# Patient Record
Sex: Female | Born: 1940 | Race: White | Hispanic: No | Smoking: Never smoker
Health system: Southern US, Community
[De-identification: ages and names within clinical notes are randomized; demographics above are authoritative.]

## PROBLEM LIST (undated history)

## (undated) DIAGNOSIS — S52509A Unspecified fracture of the lower end of unspecified radius, initial encounter for closed fracture: Secondary | ICD-10-CM

## (undated) DIAGNOSIS — I10 Essential (primary) hypertension: Secondary | ICD-10-CM

## (undated) DIAGNOSIS — J329 Chronic sinusitis, unspecified: Secondary | ICD-10-CM

## (undated) DIAGNOSIS — E785 Hyperlipidemia, unspecified: Secondary | ICD-10-CM

## (undated) HISTORY — PX: CHOLECYSTECTOMY: SHX55

## (undated) HISTORY — PX: TONSILLECTOMY: SUR1361

## (undated) HISTORY — PX: APPENDECTOMY: SHX54

## (undated) HISTORY — PX: ABDOMINAL HYSTERECTOMY: SHX81

---

## 1997-11-01 ENCOUNTER — Ambulatory Visit (HOSPITAL_COMMUNITY): Admission: RE | Admit: 1997-11-01 | Discharge: 1997-11-01 | Payer: Self-pay | Admitting: Obstetrics and Gynecology

## 1999-08-30 ENCOUNTER — Ambulatory Visit (HOSPITAL_COMMUNITY): Admission: RE | Admit: 1999-08-30 | Discharge: 1999-08-30 | Payer: Self-pay | Admitting: Family Medicine

## 1999-08-30 ENCOUNTER — Encounter: Payer: Self-pay | Admitting: Family Medicine

## 2000-10-29 ENCOUNTER — Encounter: Payer: Self-pay | Admitting: Family Medicine

## 2000-10-29 ENCOUNTER — Encounter: Admission: RE | Admit: 2000-10-29 | Discharge: 2000-10-29 | Payer: Self-pay | Admitting: Family Medicine

## 2002-01-14 ENCOUNTER — Encounter: Admission: RE | Admit: 2002-01-14 | Discharge: 2002-01-14 | Payer: Self-pay | Admitting: Family Medicine

## 2002-01-14 ENCOUNTER — Encounter: Payer: Self-pay | Admitting: Family Medicine

## 2003-02-15 ENCOUNTER — Encounter: Admission: RE | Admit: 2003-02-15 | Discharge: 2003-02-15 | Payer: Self-pay | Admitting: Family Medicine

## 2003-02-15 ENCOUNTER — Encounter: Payer: Self-pay | Admitting: Family Medicine

## 2004-03-22 ENCOUNTER — Encounter: Admission: RE | Admit: 2004-03-22 | Discharge: 2004-03-22 | Payer: Self-pay | Admitting: Family Medicine

## 2005-04-26 ENCOUNTER — Encounter: Admission: RE | Admit: 2005-04-26 | Discharge: 2005-04-26 | Payer: Self-pay | Admitting: Family Medicine

## 2005-09-10 ENCOUNTER — Encounter: Admission: RE | Admit: 2005-09-10 | Discharge: 2005-09-10 | Payer: Self-pay | Admitting: Family Medicine

## 2006-05-30 ENCOUNTER — Encounter: Admission: RE | Admit: 2006-05-30 | Discharge: 2006-05-30 | Payer: Self-pay | Admitting: Family Medicine

## 2007-06-02 ENCOUNTER — Encounter: Admission: RE | Admit: 2007-06-02 | Discharge: 2007-06-02 | Payer: Self-pay | Admitting: Family Medicine

## 2008-06-02 ENCOUNTER — Encounter: Admission: RE | Admit: 2008-06-02 | Discharge: 2008-06-02 | Payer: Self-pay | Admitting: Family Medicine

## 2008-06-13 ENCOUNTER — Encounter: Admission: RE | Admit: 2008-06-13 | Discharge: 2008-06-13 | Payer: Self-pay | Admitting: Family Medicine

## 2009-07-13 ENCOUNTER — Encounter: Admission: RE | Admit: 2009-07-13 | Discharge: 2009-07-13 | Payer: Self-pay | Admitting: Family Medicine

## 2010-07-18 ENCOUNTER — Encounter
Admission: RE | Admit: 2010-07-18 | Discharge: 2010-07-18 | Payer: Self-pay | Source: Home / Self Care | Admitting: Family Medicine

## 2011-07-10 ENCOUNTER — Other Ambulatory Visit: Payer: Self-pay | Admitting: Family Medicine

## 2011-07-10 DIAGNOSIS — Z1231 Encounter for screening mammogram for malignant neoplasm of breast: Secondary | ICD-10-CM

## 2011-08-09 ENCOUNTER — Ambulatory Visit
Admission: RE | Admit: 2011-08-09 | Discharge: 2011-08-09 | Disposition: A | Discharge status: Home / Self Care | Payer: Medicare Other | Source: Ambulatory Visit | Attending: Family Medicine | Admitting: Family Medicine

## 2011-08-09 DIAGNOSIS — Z1231 Encounter for screening mammogram for malignant neoplasm of breast: Secondary | ICD-10-CM

## 2012-07-08 ENCOUNTER — Other Ambulatory Visit: Payer: Self-pay | Admitting: Family Medicine

## 2012-07-20 ENCOUNTER — Other Ambulatory Visit: Payer: Self-pay | Admitting: Family Medicine

## 2012-07-20 DIAGNOSIS — Z1231 Encounter for screening mammogram for malignant neoplasm of breast: Secondary | ICD-10-CM

## 2012-08-28 ENCOUNTER — Ambulatory Visit
Admission: RE | Admit: 2012-08-28 | Discharge: 2012-08-28 | Disposition: A | Discharge status: Home / Self Care | Payer: Medicare Other | Source: Ambulatory Visit | Attending: Family Medicine | Admitting: Family Medicine

## 2012-08-28 DIAGNOSIS — Z1231 Encounter for screening mammogram for malignant neoplasm of breast: Secondary | ICD-10-CM

## 2013-08-10 ENCOUNTER — Other Ambulatory Visit: Payer: Self-pay

## 2013-08-10 DIAGNOSIS — Z1231 Encounter for screening mammogram for malignant neoplasm of breast: Secondary | ICD-10-CM

## 2013-09-03 ENCOUNTER — Ambulatory Visit
Admission: RE | Admit: 2013-09-03 | Discharge: 2013-09-03 | Disposition: A | Discharge status: Home / Self Care | Payer: Medicare Other | Source: Ambulatory Visit

## 2013-09-03 DIAGNOSIS — Z1231 Encounter for screening mammogram for malignant neoplasm of breast: Secondary | ICD-10-CM

## 2013-10-02 ENCOUNTER — Encounter (HOSPITAL_BASED_OUTPATIENT_CLINIC_OR_DEPARTMENT_OTHER): Payer: Self-pay | Admitting: Emergency Medicine

## 2013-10-02 ENCOUNTER — Emergency Department (HOSPITAL_BASED_OUTPATIENT_CLINIC_OR_DEPARTMENT_OTHER): Payer: Medicare Other

## 2013-10-02 ENCOUNTER — Emergency Department (HOSPITAL_BASED_OUTPATIENT_CLINIC_OR_DEPARTMENT_OTHER)
Admission: EM | Admit: 2013-10-02 | Discharge: 2013-10-02 | Disposition: A | Discharge status: Home / Self Care | Payer: Medicare Other | Attending: Emergency Medicine | Admitting: Emergency Medicine

## 2013-10-02 DIAGNOSIS — Z8709 Personal history of other diseases of the respiratory system: Secondary | ICD-10-CM | POA: Insufficient documentation

## 2013-10-02 DIAGNOSIS — S62113A Displaced fracture of triquetrum [cuneiform] bone, unspecified wrist, initial encounter for closed fracture: Secondary | ICD-10-CM | POA: Insufficient documentation

## 2013-10-02 DIAGNOSIS — Z79899 Other long term (current) drug therapy: Secondary | ICD-10-CM | POA: Insufficient documentation

## 2013-10-02 DIAGNOSIS — E119 Type 2 diabetes mellitus without complications: Secondary | ICD-10-CM | POA: Insufficient documentation

## 2013-10-02 DIAGNOSIS — Y9289 Other specified places as the place of occurrence of the external cause: Secondary | ICD-10-CM | POA: Insufficient documentation

## 2013-10-02 DIAGNOSIS — E785 Hyperlipidemia, unspecified: Secondary | ICD-10-CM | POA: Insufficient documentation

## 2013-10-02 DIAGNOSIS — Y9389 Activity, other specified: Secondary | ICD-10-CM | POA: Insufficient documentation

## 2013-10-02 DIAGNOSIS — W010XXA Fall on same level from slipping, tripping and stumbling without subsequent striking against object, initial encounter: Secondary | ICD-10-CM | POA: Insufficient documentation

## 2013-10-02 DIAGNOSIS — S61209A Unspecified open wound of unspecified finger without damage to nail, initial encounter: Secondary | ICD-10-CM | POA: Insufficient documentation

## 2013-10-02 DIAGNOSIS — Z7982 Long term (current) use of aspirin: Secondary | ICD-10-CM | POA: Insufficient documentation

## 2013-10-02 HISTORY — DX: Chronic sinusitis, unspecified: J32.9

## 2013-10-02 HISTORY — DX: Hyperlipidemia, unspecified: E78.5

## 2013-10-02 NOTE — ED Notes (Signed)
Fell on ice today, landing on her buttocks, left arm.  C/o left wrist pain.  Ambulatory without difficulty.  Denies striking head or LOC.  Currently taking antibiotic for sinusitis.

## 2013-10-02 NOTE — Discharge Instructions (Signed)
Wrist Fracture A wrist fracture is a break in one of the bones of the wrist. Your wrist is made up of several small bones at the palm of your hand (carpal bones) and the two bones that make up your forearm (radius and ulna). The bones come together to form multiple large and small joints. The shape and design of these joints allow your wrist to bend and straighten, move side-to-side, and rotate, as in twisting your palm up or down. CAUSES  A fracture may occur in any of the bones in your wrist when enough force is applied to the wrist, such as when falling down onto an outstretched hand. Severe injuries may occur from a more forceful injury. SYMPTOMS Symptoms of wrist fractures include tenderness, bruising, and swelling. Additionally, the wrist may hang in an odd position or may be misshaped. DIAGNOSIS To diagnose a wrist fracture, your caregiver will physically examine your wrist. Your caregiver may also request an X-ray exam of your wrist. TREATMENT Treatment depends on many factors, including the nature and location of the fracture, your age, and your activity level. Treatment for wrist fracture can be nonsurgical or surgical. For nonsurgical treatment, a plaster cast or splint may be applied to your wrist if the bone is in a good position (aligned). The cast will stay on for about 6 weeks. If the alignment of your bone is not good, it may be necessary to realign (reduce) it. After the bone is reduced, a splint usually is placed on your wrist to allow for a small amount of normal swelling. After about 1 week, the splint is removed and a cast is added. The cast is removed 2 or 3 weeks later, after the swelling goes down, causing the cast to loosen. Another cast is applied. This cast is removed after about another 2 or 3 weeks, for a total of 4 to 6 weeks of immobilization. Sometimes the position of the bone is so far out of place that surgery is required to apply a device to hold it together as it  heals. If the bone cannot be reduced without cutting the skin around the bone (closed reduction), a cut (incision) is made to allow direct access to the bone to reduce it (open reduction). Depending on the fracture, there are a number of options for holding the bone in place while it heals, including a cast, metal pins, a plate and screws, and a device called an external fixator. With an external fixator, most of the hardware remains outside of the body. HOME CARE INSTRUCTIONS  To lessen swelling, keep your injured wrist elevated and move your fingers as much as possible.  Apply ice to your wrist for the first 1 to 2 days after you have been treated or as directed by your caregiver. Applying ice helps to reduce inflammation and pain.  Put ice in a plastic bag.  Place a towel between your skin and the bag.  Leave the ice on for 15 to 20 minutes at a time, every 2 hours while you are awake.  Do not put pressure on any part of your cast or splint. It may break.  Use a plastic bag to protect your cast or splint from water while bathing or showering. Do not lower your cast or splint into water.  Only take over-the-counter or prescription medicines for pain as directed by your caregiver. SEEK IMMEDIATE MEDICAL CARE IF:   Your cast or splint gets damaged or breaks.  You have continued severe pain   or more swelling than you did before the cast was put on.  Your skin or fingernails below the injury turn blue or gray or feel cold or numb.  You develop decreased feeling in your fingers. MAKE SURE YOU:  Understand these instructions.  Will watch your condition.  Will get help right away if you are not doing well or get worse. Document Released: 05/08/2005 Document Revised: 10/21/2011 Document Reviewed: 08/16/2011 [employer] Patient Information 2014 [employer].  

## 2013-10-02 NOTE — ED Provider Notes (Signed)
CSN: [id number]     Arrival date & time 10/02/13  1621 History  This chart was scribed for Rolan Bucco, MD by Dorothey Baseman, ED Scribe. This patient was seen in room MH05/MH05 and the patient's care was started at 5:38 PM.    Chief Complaint  Patient presents with  . Fall   The history is provided by the patient. No language interpreter was used.   HPI Comments: Mekaylah Klich is a 73 y.o. female who presents to the Emergency Department complaining of a fall that occurred earlier today when she reports that she slipped on ice, causing her to land on her buttocks and left arm. She denies hitting her head or loss of consciousness. Patient is complaining of a constant pain with associated swelling to the left wrist that she states has been progressively worsening since the incident. She reports taking an aspirin at home without significant relief of her symptoms. Patient also presents with a small laceration to the right index finger secondary to the incident, but she applied a bandage to the area PTA and there is no active bleeding at this time. She denies headache, nausea, emesis, chest pain, shortness of breath, neck pain, back pain. Patient has a history of DM and hyperlipidemia.   Past Medical History  Diagnosis Date  . Sinusitis   . Diabetes mellitus without complication   . Hyperlipidemia    Past Surgical History  Procedure Laterality Date  . Tonsillectomy    . Appendectomy    . Cholecystectomy    . Abdominal hysterectomy     No family history on file. History  Substance Use Topics  . Smoking status: Never Smoker   . Smokeless tobacco: Not on file  . Alcohol Use: Yes   OB History   Grav Para Term Preterm Abortions TAB SAB Ect Mult Living                 Review of Systems  Constitutional: Negative for fever.  Respiratory: Negative for shortness of breath.   Cardiovascular: Negative for chest pain.  Gastrointestinal: Negative for nausea and vomiting.  Musculoskeletal:  Positive for arthralgias (left wrist) and joint swelling. Negative for back pain and neck pain.  Skin: Positive for wound (laceration, right index finger).  Neurological: Negative for syncope, weakness, numbness and headaches.    Allergies  Review of patient's allergies indicates not on file.  Home Medications   Current Outpatient Rx  Name  Route  Sig  Dispense  Refill  . aspirin 81 MG tablet   Oral   Take 81 mg by mouth daily.         Marland Kitchen estradiol (ESTRACE) 0.5 MG tablet   Oral   Take 0.5 mg by mouth daily.         Marland Kitchen losartan (COZAAR) 25 MG tablet   Oral   Take 25 mg by mouth daily.         . simvastatin (ZOCOR) 20 MG tablet   Oral   Take 20 mg by mouth daily.          Triage Vitals: BP 147/73  Pulse 83  Temp(Src) 96.8 F (36 C) (Oral)  Resp 16  Ht 5\' 1"  (1.549 m)  Wt 135 lb (61.236 kg)  BMI 25.52 kg/m2  SpO2 98%  Physical Exam  Constitutional: She is oriented to person, place, and time. She appears well-developed and well-nourished.  HENT:  Head: Normocephalic and atraumatic.  Eyes: Pupils are equal, round, and reactive to light.  Neck: Normal range of motion. Neck supple.  No pain along the cervical, thoracic, or lumbosacral spine.   Pulmonary/Chest: Effort normal. No respiratory distress.  Abdominal: Soft. She exhibits no distension.  Musculoskeletal: Normal range of motion. She exhibits no edema.  Left hand has tenderness diffusely around the wrist. Ecchymosis to the volar surface of the wrist on the ulnar side. No bony tenderness to the hand or elbow. Neurovascularly intact. Radial pulses intact. No other pain with palpation or range of motion of other extremities.   Lymphadenopathy:    She has no cervical adenopathy.  Neurological: She is alert and oriented to person, place, and time.  Skin: Skin is warm and dry. No rash noted.  Right hand has a small superficial abrasion adjacent to the lateral nail. No nailbed injury.   Psychiatric: She has a  normal mood and affect.    ED Course  Procedures (including critical care time)  DIAGNOSTIC STUDIES: Oxygen Saturation is 98% on room air, normal by my interpretation.    COORDINATION OF CARE: 5:41 PM- Discussed that x-ray results indicate a fracture in the left wrist. Will discharge patient with a splint. Advised her to follow up with the referred orthopedist. Offered patient pain medication, but she states that she does not like its effects. Discussed treatment plan with patient at bedside and patient verbalized agreement.     Labs Review Labs Reviewed - No data to display  Imaging Review Dg Wrist Complete Left  10/02/2013   CLINICAL DATA:  Larey SeatFell on the ice and injured the left wrist.  EXAM: LEFT WRIST - COMPLETE 3+ VIEW  COMPARISON:  DG WRIST COMPLETE*L* dated 09/10/2005  FINDINGS: Avulsion fracture involving the triquetrum, visualized on the lateral image. No other fractures. Osseous demineralization. Well preserved joint spaces for age. Well corticated benign appearing cyst in the lunate.  IMPRESSION: Avulsion fracture involving the triquetrum.   Electronically Signed   By: Hulan Saashomas  Lawrence M.D.   On: 10/02/2013 17:24    EKG Interpretation   None       MDM   Final diagnoses:  Triquetral chip fracture    Patient placed in a volar splint. I will refer her to followup with hand surgeon.  I personally performed the services described in this documentation, which was scribed in my presence.  The recorded information has been reviewed and considered.     Rolan BuccoMelanie Audrielle Vankuren, MD 10/02/13 [phone]

## 2015-04-26 ENCOUNTER — Other Ambulatory Visit: Payer: Self-pay | Admitting: Family Medicine

## 2015-04-26 ENCOUNTER — Ambulatory Visit
Admission: RE | Admit: 2015-04-26 | Discharge: 2015-04-26 | Disposition: A | Discharge status: Home / Self Care | Payer: Medicare Other | Source: Ambulatory Visit | Attending: Family Medicine | Admitting: Family Medicine

## 2015-04-26 DIAGNOSIS — R609 Edema, unspecified: Secondary | ICD-10-CM

## 2015-04-26 DIAGNOSIS — W19XXXA Unspecified fall, initial encounter: Secondary | ICD-10-CM

## 2015-05-15 ENCOUNTER — Encounter (HOSPITAL_BASED_OUTPATIENT_CLINIC_OR_DEPARTMENT_OTHER): Payer: Self-pay | Admitting: *Deleted

## 2015-05-15 ENCOUNTER — Other Ambulatory Visit: Payer: Self-pay | Admitting: Orthopedic Surgery

## 2015-05-16 ENCOUNTER — Other Ambulatory Visit: Payer: Self-pay

## 2015-05-16 ENCOUNTER — Encounter (HOSPITAL_BASED_OUTPATIENT_CLINIC_OR_DEPARTMENT_OTHER)
Admission: RE | Admit: 2015-05-16 | Discharge: 2015-05-16 | Disposition: A | Discharge status: Home / Self Care | Payer: Medicare Other | Source: Ambulatory Visit | Attending: Orthopedic Surgery | Admitting: Orthopedic Surgery

## 2015-05-16 DIAGNOSIS — X58XXXA Exposure to other specified factors, initial encounter: Secondary | ICD-10-CM | POA: Diagnosis not present

## 2015-05-16 DIAGNOSIS — I10 Essential (primary) hypertension: Secondary | ICD-10-CM | POA: Diagnosis not present

## 2015-05-16 DIAGNOSIS — E785 Hyperlipidemia, unspecified: Secondary | ICD-10-CM | POA: Diagnosis not present

## 2015-05-16 DIAGNOSIS — Z7982 Long term (current) use of aspirin: Secondary | ICD-10-CM | POA: Diagnosis not present

## 2015-05-16 DIAGNOSIS — S52572A Other intraarticular fracture of lower end of left radius, initial encounter for closed fracture: Secondary | ICD-10-CM | POA: Diagnosis not present

## 2015-05-16 LAB — BASIC METABOLIC PANEL
Anion gap: 7 (ref 5–15)
BUN: 19 mg/dL (ref 6–20)
CALCIUM: 9.3 mg/dL (ref 8.9–10.3)
CO2: 26 mmol/L (ref 22–32)
CREATININE: 0.77 mg/dL (ref 0.44–1.00)
Chloride: 106 mmol/L (ref 101–111)
GLUCOSE: 99 mg/dL (ref 65–99)
Potassium: 3.9 mmol/L (ref 3.5–5.1)
Sodium: 139 mmol/L (ref 135–145)

## 2015-05-18 ENCOUNTER — Encounter (HOSPITAL_BASED_OUTPATIENT_CLINIC_OR_DEPARTMENT_OTHER): Payer: Self-pay

## 2015-05-18 ENCOUNTER — Ambulatory Visit (HOSPITAL_BASED_OUTPATIENT_CLINIC_OR_DEPARTMENT_OTHER)
Admission: RE | Admit: 2015-05-18 | Discharge: 2015-05-18 | Disposition: A | Discharge status: Home / Self Care | Payer: Medicare Other | Source: Ambulatory Visit | Attending: Orthopedic Surgery | Admitting: Orthopedic Surgery

## 2015-05-18 ENCOUNTER — Ambulatory Visit (HOSPITAL_BASED_OUTPATIENT_CLINIC_OR_DEPARTMENT_OTHER): Payer: Medicare Other | Admitting: Anesthesiology

## 2015-05-18 ENCOUNTER — Encounter (HOSPITAL_BASED_OUTPATIENT_CLINIC_OR_DEPARTMENT_OTHER)
Admission: RE | Disposition: A | Discharge status: Home / Self Care | Payer: Self-pay | Source: Ambulatory Visit | Attending: Orthopedic Surgery

## 2015-05-18 DIAGNOSIS — Z7982 Long term (current) use of aspirin: Secondary | ICD-10-CM | POA: Insufficient documentation

## 2015-05-18 DIAGNOSIS — E785 Hyperlipidemia, unspecified: Secondary | ICD-10-CM | POA: Insufficient documentation

## 2015-05-18 DIAGNOSIS — S52572A Other intraarticular fracture of lower end of left radius, initial encounter for closed fracture: Secondary | ICD-10-CM | POA: Insufficient documentation

## 2015-05-18 DIAGNOSIS — I10 Essential (primary) hypertension: Secondary | ICD-10-CM | POA: Diagnosis not present

## 2015-05-18 DIAGNOSIS — X58XXXA Exposure to other specified factors, initial encounter: Secondary | ICD-10-CM | POA: Insufficient documentation

## 2015-05-18 HISTORY — DX: Essential (primary) hypertension: I10

## 2015-05-18 HISTORY — PX: OPEN REDUCTION INTERNAL FIXATION (ORIF) DISTAL RADIAL FRACTURE: SHX5989

## 2015-05-18 HISTORY — DX: Unspecified fracture of the lower end of unspecified radius, initial encounter for closed fracture: S52.509A

## 2015-05-18 SURGERY — OPEN REDUCTION INTERNAL FIXATION (ORIF) DISTAL RADIUS FRACTURE
Anesthesia: Regional | Site: Wrist | Laterality: Left

## 2015-05-18 MED ORDER — LIDOCAINE HCL (CARDIAC) 20 MG/ML IV SOLN
INTRAVENOUS | Status: DC | PRN
  Administered 2015-05-18: 50 mg via INTRAVENOUS

## 2015-05-18 MED ORDER — SUCCINYLCHOLINE CHLORIDE 20 MG/ML IJ SOLN
INTRAMUSCULAR | Status: DC | PRN
  Administered 2015-05-18: 100 mg via INTRAVENOUS

## 2015-05-18 MED ORDER — CEFAZOLIN SODIUM-DEXTROSE 2-3 GM-% IV SOLR
2.0000 g | INTRAVENOUS | Status: AC
  Administered 2015-05-18: 2 g via INTRAVENOUS

## 2015-05-18 MED ORDER — DEXAMETHASONE SODIUM PHOSPHATE 10 MG/ML IJ SOLN
INTRAMUSCULAR | Status: DC | PRN
  Administered 2015-05-18: 10 mg via INTRAVENOUS

## 2015-05-18 MED ORDER — FENTANYL CITRATE (PF) 100 MCG/2ML IJ SOLN
50.0000 ug | INTRAMUSCULAR | Status: DC | PRN
  Administered 2015-05-18: 50 ug via INTRAVENOUS
  Administered 2015-05-18: 100 ug via INTRAVENOUS

## 2015-05-18 MED ORDER — PROMETHAZINE HCL 25 MG/ML IJ SOLN: 6.2500 mg | INTRAMUSCULAR | Status: DC | PRN

## 2015-05-18 MED ORDER — ONDANSETRON HCL 4 MG/2ML IJ SOLN
INTRAMUSCULAR | Status: AC
Start: 2015-05-18 — End: 2015-05-18
  Filled 2015-05-18: qty 2

## 2015-05-18 MED ORDER — EPHEDRINE SULFATE 50 MG/ML IJ SOLN
INTRAMUSCULAR | Status: AC
  Filled 2015-05-18: qty 1

## 2015-05-18 MED ORDER — DEXAMETHASONE SODIUM PHOSPHATE 10 MG/ML IJ SOLN
INTRAMUSCULAR | Status: AC
  Filled 2015-05-18: qty 1

## 2015-05-18 MED ORDER — MIDAZOLAM HCL 2 MG/2ML IJ SOLN: 1.0000 mg | INTRAMUSCULAR | Status: DC | PRN

## 2015-05-18 MED ORDER — PROPOFOL 10 MG/ML IV BOLUS
INTRAVENOUS | Status: DC | PRN
  Administered 2015-05-18: 150 mg via INTRAVENOUS

## 2015-05-18 MED ORDER — HYDROCODONE-ACETAMINOPHEN 5-325 MG PO TABS: ORAL_TABLET | ORAL | Status: DC

## 2015-05-18 MED ORDER — MIDAZOLAM HCL 2 MG/2ML IJ SOLN
INTRAMUSCULAR | Status: AC
Start: 2015-05-18 — End: 2015-05-18
  Filled 2015-05-18: qty 2

## 2015-05-18 MED ORDER — DEXAMETHASONE SODIUM PHOSPHATE 4 MG/ML IJ SOLN
INTRAMUSCULAR | Status: DC | PRN
Start: 2015-05-18 — End: 2015-05-18
  Administered 2015-05-18: 10 mg via INTRAVENOUS

## 2015-05-18 MED ORDER — PROPOFOL 500 MG/50ML IV EMUL
INTRAVENOUS | Status: AC
  Filled 2015-05-18: qty 50

## 2015-05-18 MED ORDER — SUCCINYLCHOLINE CHLORIDE 20 MG/ML IJ SOLN
INTRAMUSCULAR | Status: AC
  Filled 2015-05-18: qty 1

## 2015-05-18 MED ORDER — OXYCODONE HCL 5 MG PO TABS
5.0000 mg | ORAL_TABLET | Freq: Once | ORAL | Status: AC
Start: 2015-05-18 — End: 2015-05-18
  Administered 2015-05-18: 5 mg via ORAL

## 2015-05-18 MED ORDER — FENTANYL CITRATE (PF) 100 MCG/2ML IJ SOLN
INTRAMUSCULAR | Status: AC
  Filled 2015-05-18: qty 4

## 2015-05-18 MED ORDER — ONDANSETRON HCL 4 MG/2ML IJ SOLN
INTRAMUSCULAR | Status: DC | PRN
  Administered 2015-05-18: 4 mg via INTRAVENOUS

## 2015-05-18 MED ORDER — CEFAZOLIN SODIUM-DEXTROSE 2-3 GM-% IV SOLR
INTRAVENOUS | Status: AC
  Filled 2015-05-18: qty 50

## 2015-05-18 MED ORDER — LIDOCAINE HCL (CARDIAC) 20 MG/ML IV SOLN
INTRAVENOUS | Status: AC
  Filled 2015-05-18: qty 5

## 2015-05-18 MED ORDER — HYDROMORPHONE HCL 1 MG/ML IJ SOLN
0.2500 mg | INTRAMUSCULAR | Status: DC | PRN
  Administered 2015-05-18: 0.5 mg via INTRAVENOUS
  Administered 2015-05-18 (×3): 0.25 mg via INTRAVENOUS
  Administered 2015-05-18: 0.5 mg via INTRAVENOUS
  Administered 2015-05-18: 0.25 mg via INTRAVENOUS

## 2015-05-18 MED ORDER — FENTANYL CITRATE (PF) 100 MCG/2ML IJ SOLN
INTRAMUSCULAR | Status: AC
  Filled 2015-05-18: qty 2

## 2015-05-18 MED ORDER — BUPIVACAINE-EPINEPHRINE (PF) 0.5% -1:200000 IJ SOLN
INTRAMUSCULAR | Status: DC | PRN
  Administered 2015-05-18: 25 mL via PERINEURAL

## 2015-05-18 MED ORDER — HYDROMORPHONE HCL 1 MG/ML IJ SOLN
INTRAMUSCULAR | Status: AC
  Filled 2015-05-18: qty 1

## 2015-05-18 MED ORDER — CHLORHEXIDINE GLUCONATE 4 % EX LIQD: 60.0000 mL | Freq: Once | CUTANEOUS | Status: DC

## 2015-05-18 MED ORDER — SCOPOLAMINE 1 MG/3DAYS TD PT72: 1.0000 | MEDICATED_PATCH | Freq: Once | TRANSDERMAL | Status: DC | PRN

## 2015-05-18 MED ORDER — LACTATED RINGERS IV SOLN
INTRAVENOUS | Status: DC
  Administered 2015-05-18 (×2): via INTRAVENOUS

## 2015-05-18 MED ORDER — GLYCOPYRROLATE 0.2 MG/ML IJ SOLN: 0.2000 mg | Freq: Once | INTRAMUSCULAR | Status: DC | PRN

## 2015-05-18 MED ORDER — EPHEDRINE SULFATE 50 MG/ML IJ SOLN
INTRAMUSCULAR | Status: DC | PRN
  Administered 2015-05-18: 10 mg via INTRAVENOUS

## 2015-05-18 MED ORDER — OXYCODONE HCL 5 MG PO TABS
ORAL_TABLET | ORAL | Status: AC
  Filled 2015-05-18: qty 1

## 2015-05-18 SURGICAL SUPPLY — 67 items
BANDAGE ELASTIC 3 VELCRO ST LF (GAUZE/BANDAGES/DRESSINGS) ×3 IMPLANT
BIT DRILL 2.0 LNG QUCK RELEASE (BIT) ×1 IMPLANT
BIT DRILL 2.8X5 QR DISP (BIT) ×3 IMPLANT
BLADE SURG 15 STRL LF DISP TIS (BLADE) ×2 IMPLANT
BLADE SURG 15 STRL SS (BLADE) ×4
BNDG ESMARK 4X9 LF (GAUZE/BANDAGES/DRESSINGS) ×3 IMPLANT
BNDG GAUZE ELAST 4 BULKY (GAUZE/BANDAGES/DRESSINGS) ×3 IMPLANT
BNDG PLASTER X FAST 3X3 WHT LF (CAST SUPPLIES) IMPLANT
CHLORAPREP W/TINT 26ML (MISCELLANEOUS) ×3 IMPLANT
CORDS BIPOLAR (ELECTRODE) ×3 IMPLANT
COVER BACK TABLE 60X90IN (DRAPES) ×3 IMPLANT
COVER MAYO STAND STRL (DRAPES) ×3 IMPLANT
DRAPE EXTREMITY T 121X128X90 (DRAPE) ×3 IMPLANT
DRAPE OEC MINIVIEW 54X84 (DRAPES) ×3 IMPLANT
DRAPE SURG 17X23 STRL (DRAPES) ×3 IMPLANT
DRILL 2.0 LNG QUICK RELEASE (BIT) ×3
GAUZE SPONGE 4X4 12PLY STRL (GAUZE/BANDAGES/DRESSINGS) ×3 IMPLANT
GAUZE XEROFORM 1X8 LF (GAUZE/BANDAGES/DRESSINGS) ×3 IMPLANT
GLOVE BIO SURGEON STRL SZ7.5 (GLOVE) ×3 IMPLANT
GLOVE BIOGEL M STRL SZ7.5 (GLOVE) ×6 IMPLANT
GLOVE BIOGEL PI IND STRL 7.0 (GLOVE) ×1 IMPLANT
GLOVE BIOGEL PI IND STRL 8 (GLOVE) ×2 IMPLANT
GLOVE BIOGEL PI IND STRL 8.5 (GLOVE) IMPLANT
GLOVE BIOGEL PI INDICATOR 7.0 (GLOVE) ×2
GLOVE BIOGEL PI INDICATOR 8 (GLOVE) ×4
GLOVE BIOGEL PI INDICATOR 8.5 (GLOVE)
GLOVE SURG ORTHO 8.0 STRL STRW (GLOVE) IMPLANT
GOWN STRL REUS W/ TWL LRG LVL3 (GOWN DISPOSABLE) ×1 IMPLANT
GOWN STRL REUS W/TWL LRG LVL3 (GOWN DISPOSABLE) ×2
GOWN STRL REUS W/TWL XL LVL3 (GOWN DISPOSABLE) ×9 IMPLANT
GUIDEWIRE ORTHO 0.054X6 (WIRE) ×9 IMPLANT
NEEDLE HYPO 25X1 1.5 SAFETY (NEEDLE) IMPLANT
NS IRRIG 1000ML POUR BTL (IV SOLUTION) ×3 IMPLANT
PACK BASIN DAY SURGERY FS (CUSTOM PROCEDURE TRAY) ×3 IMPLANT
PAD CAST 3X4 CTTN HI CHSV (CAST SUPPLIES) ×1 IMPLANT
PADDING CAST ABS 4INX4YD NS (CAST SUPPLIES) ×2
PADDING CAST ABS COTTON 4X4 ST (CAST SUPPLIES) ×1 IMPLANT
PADDING CAST COTTON 3X4 STRL (CAST SUPPLIES) ×2
PLATE PROX NARROW LEFT (Plate) ×3 IMPLANT
SCREW ACTK 2 NL HEX 3.5.11 (Screw) ×3 IMPLANT
SCREW BN FT 16X2.3XLCK HEX CRT (Screw) ×1 IMPLANT
SCREW CORT FT 20X2.3XLCK HEX (Screw) ×1 IMPLANT
SCREW CORTICAL LOCKING 2.3X16M (Screw) ×8 IMPLANT
SCREW CORTICAL LOCKING 2.3X18M (Screw) ×2 IMPLANT
SCREW CORTICAL LOCKING 2.3X20M (Screw) ×2 IMPLANT
SCREW FX16X2.3XLCK SMTH NS CRT (Screw) ×3 IMPLANT
SCREW FX18X2.3XSMTH LCK NS CRT (Screw) ×1 IMPLANT
SCREW NLCKG 13 3.5X13 HEXA (Screw) ×1 IMPLANT
SCREW NON-LOCK 3.5X13 (Screw) ×3 IMPLANT
SCREW NONLOCK HEX 3.5X12 (Screw) ×3 IMPLANT
SLEEVE SCD COMPRESS KNEE MED (MISCELLANEOUS) ×3 IMPLANT
STOCKINETTE 4X48 STRL (DRAPES) ×3 IMPLANT
SUCTION FRAZIER TIP 10 FR DISP (SUCTIONS) IMPLANT
SUT ETHILON 3 0 PS 1 (SUTURE) IMPLANT
SUT ETHILON 4 0 PS 2 18 (SUTURE) ×3 IMPLANT
SUT VIC AB 2-0 SH 27 (SUTURE)
SUT VIC AB 2-0 SH 27XBRD (SUTURE) IMPLANT
SUT VIC AB 3-0 PS1 18 (SUTURE)
SUT VIC AB 3-0 PS1 18XBRD (SUTURE) IMPLANT
SUT VICRYL 4-0 PS2 18IN ABS (SUTURE) ×3 IMPLANT
SYR BULB 3OZ (MISCELLANEOUS) ×3 IMPLANT
SYR CONTROL 10ML LL (SYRINGE) IMPLANT
TOWEL OR 17X24 6PK STRL BLUE (TOWEL DISPOSABLE) ×6 IMPLANT
TOWEL OR NON WOVEN STRL DISP B (DISPOSABLE) ×3 IMPLANT
TUBE CONNECTING 20'X1/4 (TUBING)
TUBE CONNECTING 20X1/4 (TUBING) IMPLANT
UNDERPAD 30X30 (UNDERPADS AND DIAPERS) ×3 IMPLANT

## 2015-05-18 NOTE — Discharge Instructions (Addendum)

## 2015-05-18 NOTE — Brief Op Note (Signed)
05/18/2015  10:11 AM  PATIENT:  Jill Everett  74 y.o. female  PRE-OPERATIVE DIAGNOSIS:  LEFT DISTAL RAIDUS FRACTURE  POST-OPERATIVE DIAGNOSIS:  LEFT DISTAL RAIDUS FRACTURE  PROCEDURE:  Procedure(s): OPEN REDUCTION INTERNAL FIXATION (ORIF) LEFT DISTAL RADIAL FRACTURE (Left)  SURGEON:  Surgeon(s) and Role:    * Betha Loa, MD - Primary  PHYSICIAN ASSISTANT:   ASSISTANTS: Annye Rusk, PA   ANESTHESIA:   regional and general  EBL:  Total I/O In: 1200 [I.V.:1200] Out: -   BLOOD ADMINISTERED:none  DRAINS: none   LOCAL MEDICATIONS USED:  NONE  SPECIMEN:  No Specimen  DISPOSITION OF SPECIMEN:  N/A  COUNTS:  YES  TOURNIQUET:   Total Tourniquet Time Documented: Upper Arm (Left) - 64 minutes Total: Upper Arm (Left) - 64 minutes   DICTATION: .Other Dictation: Dictation Number [id number]  PLAN OF CARE: Discharge to home after PACU  PATIENT DISPOSITION:  PACU - hemodynamically stable.

## 2015-05-18 NOTE — H&P (Signed)
Jill Everett is an 74 y.o. female.   Chief Complaint: left distal radius fracture HPI: 74 yo rhd female states she fell 04/26/15 injuring left wrist.  Seen at PCP where XR revealed distal radius fracture.  Followed up in office.  She reports previous wrist fracture that did not require surgery.  Attempted non operative treatment initially with subsequent displacement of fracture.  She wishes to undergo operative fixation.  Past Medical History  Diagnosis Date  . Sinusitis   . Hyperlipidemia   . Hypertension   . Distal radial fracture     right    Past Surgical History  Procedure Laterality Date  . Tonsillectomy    . Appendectomy    . Cholecystectomy    . Abdominal hysterectomy      History reviewed. No pertinent family history. Social History:  reports that she has never smoked. She does not have any smokeless tobacco history on file. She reports that she drinks alcohol. She reports that she does not use illicit drugs.  Allergies:  Allergies  Allergen Reactions  . Pine Other (See Comments)    Pine nuts "Made me hot and throw up"    Medications Prior to Admission  Medication Sig Dispense Refill  . aspirin 81 MG tablet Take 81 mg by mouth daily.    . Biotin (BIOTIN 5000) 5 MG CAPS Take by mouth.    . Calcium Carbonate-Vitamin D (CALCIUM-VITAMIN D) 500-200 MG-UNIT tablet Take 1 tablet by mouth daily.    Marland Kitchen losartan (COZAAR) 25 MG tablet Take 25 mg by mouth daily.    . Multiple Vitamin (MULTIVITAMIN WITH MINERALS) TABS tablet Take 1 tablet by mouth daily.    . Omega-3 Fatty Acids (FISH OIL) 1000 MG CAPS Take by mouth.    . simvastatin (ZOCOR) 20 MG tablet Take 20 mg by mouth daily.      Results for orders placed or performed during the hospital encounter of 05/18/15 (from the past 48 hour(s))  Basic metabolic panel     Status: None   Collection Time: 05/16/15  9:21 AM  Result Value Ref Range   Sodium 139 135 - 145 mmol/L   Potassium 3.9 3.5 - 5.1 mmol/L   Chloride 106 101  - 111 mmol/L   CO2 26 22 - 32 mmol/L   Glucose, Bld 99 65 - 99 mg/dL   BUN 19 6 - 20 mg/dL   Creatinine, Ser 0.77 0.44 - 1.00 mg/dL   Calcium 9.3 8.9 - 10.3 mg/dL   GFR calc non Af Amer >60 >60 mL/min   GFR calc Af Amer >60 >60 mL/min    Comment: (NOTE) The eGFR has been calculated using the CKD EPI equation. This calculation has not been validated in all clinical situations. eGFR's persistently <60 mL/min signify possible Chronic Kidney Disease.    Anion gap 7 5 - 15    No results found.   A comprehensive review of systems was negative except for: Eyes: positive for contacts/glasses  Blood pressure 143/60, pulse 78, temperature 97.7 F (36.5 C), temperature source Oral, resp. rate 15, height '5\' 1"'  (1.549 m), weight 60.555 kg (133 lb 8 oz), SpO2 98 %.  General appearance: alert, cooperative and appears stated age Head: Normocephalic, without obvious abnormality, atraumatic Neck: supple, symmetrical, trachea midline Resp: clear to auscultation bilaterally Cardio: regular rate and rhythm GI: non tender Extremities: intact sensation and capillary refill all digits.  +epl/fpl/io.  no wounds. Pulses: 2+ and symmetric Skin: Skin color, texture, turgor normal. No rashes  or lesions Neurologic: Grossly normal Incision/Wound: none  Assessment/Plan Left comminuted intraarticular distal radius fracture.  Non operative and operative treatment options were discussed with the patient and patient wishes to proceed with operative treatment. Risks, benefits, and alternatives of surgery were discussed and the patient agrees with the plan of care.  Perrie Ragin R 05/18/2015, 8:25 AM

## 2015-05-18 NOTE — Anesthesia Preprocedure Evaluation (Addendum)
Anesthesia Evaluation  Patient identified by MRN, date of birth, ID band Patient awake    Reviewed: Allergy & Precautions, NPO status , Patient's Chart, lab work & pertinent test results  Airway Mallampati: I  TM Distance: >3 FB Neck ROM: Full    Dental  (+) Teeth Intact, Upper Dentures, Partial Lower, Dental Advisory Given   Pulmonary    breath sounds clear to auscultation       Cardiovascular hypertension, Pt. on medications  Rhythm:Regular Rate:Normal     Neuro/Psych    GI/Hepatic   Endo/Other    Renal/GU      Musculoskeletal   Abdominal   Peds  Hematology   Anesthesia Other Findings   Reproductive/Obstetrics                           Anesthesia Physical Anesthesia Plan  ASA: II  Anesthesia Plan: General and Regional   Post-op Pain Management:    Induction: Intravenous, Rapid sequence and Cricoid pressure planned  Airway Management Planned: LMA  Additional Equipment:   Intra-op Plan:   Post-operative Plan: Extubation in OR  Informed Consent: I have reviewed the patients History and Physical, chart, labs and discussed the procedure including the risks, benefits and alternatives for the proposed anesthesia with the patient or authorized representative who has indicated his/her understanding and acceptance.   Dental advisory given  Plan Discussed with: CRNA, Anesthesiologist and Surgeon  Anesthesia Plan Comments:        Anesthesia Quick Evaluation

## 2015-05-18 NOTE — Anesthesia Procedure Notes (Addendum)
Anesthesia Regional Block:  Supraclavicular block  Pre-Anesthetic Checklist: ,, timeout performed, Correct Patient, Correct Site, Correct Laterality, Correct Procedure, Correct Position, site marked, Risks and benefits discussed,  Surgical consent,  Pre-op evaluation,  At surgeon's request and post-op pain management  Laterality: Left and Upper  Prep: chloraprep       Needles:  Injection technique: Single-shot  Needle Type: Echogenic Stimulator Needle     Needle Length: 5cm 5 cm Needle Gauge: 21 and 21 G    Additional Needles:  Procedures: ultrasound guided (picture in chart) Supraclavicular block Narrative:  Start time: 05/18/2015 7:53 AM End time: 05/18/2015 7:58 AM Injection made incrementally with aspirations every 5 mL.  Performed by: Personally  Anesthesiologist: CREWS, DAVID   Procedure Name: Intubation Date/Time: 05/18/2015 8:42 AM Performed by: Zenia Resides D Pre-anesthesia Checklist: Patient identified, Emergency Drugs available, Suction available and Patient being monitored Patient Re-evaluated:Patient Re-evaluated prior to inductionOxygen Delivery Method: Circle System Utilized Preoxygenation: Pre-oxygenation with 100% oxygen Intubation Type: IV induction, Rapid sequence and Cricoid Pressure applied Ventilation: Mask ventilation without difficulty Laryngoscope Size: Mac and 3 Grade View: Grade I Tube type: Oral Tube size: 7.0 mm Number of attempts: 1 Airway Equipment and Method: Stylet and Oral airway Placement Confirmation: ETT inserted through vocal cords under direct vision,  positive ETCO2 and breath sounds checked- equal and bilateral Secured at: 22 cm Tube secured with: Tape Dental Injury: Teeth and Oropharynx as per pre-operative assessment       Left side [place] image for SCB.

## 2015-05-18 NOTE — Transfer of Care (Signed)
Immediate Anesthesia Transfer of Care Note  Patient: Jill Everett  Procedure(s) Performed: Procedure(s): OPEN REDUCTION INTERNAL FIXATION (ORIF) LEFT DISTAL RADIAL FRACTURE (Left)  Patient Location: PACU  Anesthesia Type:GA combined with regional for post-op pain  Level of Consciousness: awake  Airway & Oxygen Therapy: Patient Spontanous Breathing and Patient connected to face mask oxygen  Post-op Assessment: Report given to RN and Post -op Vital signs reviewed and stable  Post vital signs: Reviewed and stable  Last Vitals:  Filed Vitals:   05/18/15 0815  BP: 143/60  Pulse: 78  Temp:   Resp: 15    Complications: No apparent anesthesia complications

## 2015-05-18 NOTE — Op Note (Signed)
NAMEElita Everett NO.:  [id number]  MEDICAL RECORD NO.:  [id number]  LOCATION:                                 FACILITY:  PHYSICIAN:  Betha Loa, MD             DATE OF BIRTH:  DATE OF PROCEDURE:  05/18/2015 DATE OF DISCHARGE:                              OPERATIVE REPORT   PREOPERATIVE DIAGNOSIS:  Left distal radius comminuted intra-articular fracture.  POSTOPERATIVE DIAGNOSIS:  Left distal radius comminuted intra-articular fracture.  PROCEDURE:   1. Open reduction and internal fixation of left comminuted intra-articular distal radius fracture  2. Left brachioradialis release.  SURGEON:  Betha Loa, MD.  ASSISTANT:  Annye Rusk, PA.  ANESTHESIA:  General with regional.  IV FLUIDS:  Per anesthesia flow sheet.  ESTIMATED BLOOD LOSS:  Minimal.  COMPLICATIONS:  None.  SPECIMENS:  None.  TOURNIQUET TIME:  64 minutes.  DISPOSITION:  Stable to PACU.  INDICATIONS:  The patient is a 74 year old female who fell approximately 3 weeks ago injuring her left wrist.  She was seen at the primary care physician's office where radiographs were taken revealing distal wrist fracture.  She followed up in the office.  We tried initially non- operative treatment.  This ended up in displacement of the fracture. She wished to proceed with operative fixation.  Risks, benefits, and alternatives of surgery were discussed including risk of blood loss; infection; damage to nerves, vessels, tendons, ligaments, bone; failure of surgery; need for additional surgery; complications with wound healing, continued pain, nonunion, malunion, stiffness.  She voiced understanding of these risks and selected to proceed.  OPERATIVE COURSE:  After being identified preoperatively by myself, the patient and I agreed upon the procedure and site of procedure.  Surgical site was marked.  The risks, benefits, and alternatives of surgery were reviewed, and she wished to proceed.   Surgical consent had been signed. She was given IV Ancef as preoperative antibiotic prophylaxis.  She was transported to the operating room and placed on the operating table in supine position with the left upper extremity on arm board.  A regional block had been performed by Anesthesia in preoperative holding.  General anesthesia was induced in the operative room.  Left upper extremity was prepped and draped in normal sterile orthopedic fashion.  A surgical pause was performed between surgeons, Anesthesia, operating room staff, and all were in agreement with the patient, procedure, and site of procedure.  Tourniquet at the proximal aspect of the extremity was inflated to 250 mmHg after exsanguination of the limb with an Esmarch bandage.  Standard volar Sherilyn Cooter approach was used.  Bipolar electrocautery was used to obtain hemostasis.  The superficial and deep portions of the FCR tendon sheath were incised.  The FCR and FPL were swept ulnarly to protect the palmar cutaneous branch of the median nerve.  The brachioradialis was released at the radial side of the radius.  The pronator quadratus was released and elevated.  The fracture site was identified.  It was mobilized.  The C-arm was used in AP, lateral, and oblique projections throughout  the case to aid in reduction.  Reduction of intra-articular fragmentation was performed. It was not felt the bone graft was necessary.  A nerve plate from the [employer] volar distal radial locking set was selected and secured to the bone with guide pins.  The position and reduction were confirmed on radiographs.  Standard AO drilling measuring technique was used.  A single screw was placed in a slotted hole in the shaft of the plate. The distal holes were filled with the locking pegs with the exception of the styloid holes, which were filled with locking screws.  The remaining two holes in the shaft of the plate were filled with nonlocking screws. Good  purchase was obtained.  C-arm was used in AP, lateral, and oblique projections to ensure appropriate reduction and placement of the hardware which was the case.  There was no intra-articular penetration. Good reduction of the articular surface had been achieved.  The wound was copiously irrigated with sterile saline.  The pronator quadratus was repaired back over top of the plate using 4-0 Vicryl suture in a figure- of-eight fashion.  Two inverted interrupted Vicryl sutures were placed in subcutaneous tissues.  The skin was closed with 4-0 nylon in a horizontal mattress fashion.  Good pronation and supination of the wrist was noted.  The wound was dressed with sterile Xeroform, 4x4s, and wrapped with a Kerlix bandage.  A volar splint was placed and wrapped with Kerlix and Ace bandage.  Tourniquet was deflated at 64 minutes. Fingertips were pink with brisk capillary refill after deflation of the tourniquet.  Operative drapes were broken down.  The patient was awoken from anesthesia safely.  She was transferred back to the stretcher and taken to PACU in stable condition.  I will see her back in the office in one week for postoperative followup.  She was given Norco 5/325, 1-2 p.o. q.6 hours p.r.n. pain, dispensed #40.     Betha Loa, MD     KK/MEDQ  D:  05/18/2015  T:  05/18/2015  Job:  [id number]

## 2015-05-18 NOTE — Progress Notes (Signed)
  Assisted Dr. Crews with left, ultrasound guided, supraclavicular block. Side rails up, monitors on throughout procedure. See vital signs in flow sheet. Tolerated Procedure well. 

## 2015-05-18 NOTE — Op Note (Signed)
Intra-operative fluoroscopic images in the AP, lateral, and oblique views were taken and evaluated by myself.  Reduction and hardware placement were confirmed.  There was no intraarticular penetration of permanent hardware.  

## 2015-05-18 NOTE — Anesthesia Postprocedure Evaluation (Signed)
  Anesthesia Post-op Note  Patient: Jill Everett  Procedure(s) Performed: Procedure(s): OPEN REDUCTION INTERNAL FIXATION (ORIF) LEFT DISTAL RADIAL FRACTURE (Left)  Patient Location: PACU  Anesthesia Type:General and GA combined with regional for post-op pain  Level of Consciousness: awake and alert   Airway and Oxygen Therapy: Patient Spontanous Breathing  Post-op Pain: none  Post-op Assessment: Post-op Vital signs reviewed              Post-op Vital Signs: stable  Last Vitals:  Filed Vitals:   05/18/15 1100  BP: 124/59  Pulse: 80  Temp:   Resp: 11    Complications: No apparent anesthesia complications

## 2015-05-18 NOTE — Op Note (Signed)
[id number] 

## 2015-05-19 ENCOUNTER — Encounter (HOSPITAL_BASED_OUTPATIENT_CLINIC_OR_DEPARTMENT_OTHER): Payer: Self-pay | Admitting: Orthopedic Surgery

## 2015-05-19 LAB — POCT HEMOGLOBIN-HEMACUE: Hemoglobin: 15.1 g/dL — ABNORMAL HIGH (ref 12.0–15.0)

## 2015-10-24 ENCOUNTER — Other Ambulatory Visit: Payer: Self-pay

## 2015-10-24 DIAGNOSIS — Z1231 Encounter for screening mammogram for malignant neoplasm of breast: Secondary | ICD-10-CM

## 2015-11-14 ENCOUNTER — Ambulatory Visit
Admission: RE | Admit: 2015-11-14 | Discharge: 2015-11-14 | Disposition: A | Discharge status: Home / Self Care | Payer: Medicare Other | Source: Ambulatory Visit

## 2015-11-14 DIAGNOSIS — Z1231 Encounter for screening mammogram for malignant neoplasm of breast: Secondary | ICD-10-CM

## 2017-06-16 ENCOUNTER — Other Ambulatory Visit: Payer: Self-pay | Admitting: Family Medicine

## 2017-06-16 DIAGNOSIS — Z1231 Encounter for screening mammogram for malignant neoplasm of breast: Secondary | ICD-10-CM

## 2017-07-07 ENCOUNTER — Ambulatory Visit
Admission: RE | Admit: 2017-07-07 | Discharge: 2017-07-07 | Disposition: A | Discharge status: Home / Self Care | Payer: Medicare Other | Source: Ambulatory Visit | Attending: Family Medicine | Admitting: Family Medicine

## 2017-07-07 DIAGNOSIS — Z1231 Encounter for screening mammogram for malignant neoplasm of breast: Secondary | ICD-10-CM

## 2018-07-02 ENCOUNTER — Other Ambulatory Visit: Payer: Self-pay | Admitting: Family Medicine

## 2018-07-02 DIAGNOSIS — Z1231 Encounter for screening mammogram for malignant neoplasm of breast: Secondary | ICD-10-CM

## 2018-07-23 ENCOUNTER — Ambulatory Visit
Admission: RE | Admit: 2018-07-23 | Discharge: 2018-07-23 | Disposition: A | Discharge status: Home / Self Care | Payer: Medicare Other | Source: Ambulatory Visit | Attending: Family Medicine | Admitting: Family Medicine

## 2018-07-23 DIAGNOSIS — Z1231 Encounter for screening mammogram for malignant neoplasm of breast: Secondary | ICD-10-CM

## 2018-09-09 ENCOUNTER — Other Ambulatory Visit: Payer: Self-pay | Admitting: Gastroenterology

## 2018-09-09 DIAGNOSIS — R1310 Dysphagia, unspecified: Secondary | ICD-10-CM

## 2018-09-16 ENCOUNTER — Ambulatory Visit
Admission: RE | Admit: 2018-09-16 | Discharge: 2018-09-16 | Disposition: A | Discharge status: Home / Self Care | Payer: Medicare Other | Source: Ambulatory Visit | Attending: Gastroenterology | Admitting: Gastroenterology

## 2018-09-16 DIAGNOSIS — R1310 Dysphagia, unspecified: Secondary | ICD-10-CM

## 2020-09-18 DIAGNOSIS — K219 Gastro-esophageal reflux disease without esophagitis: Secondary | ICD-10-CM | POA: Diagnosis not present

## 2020-09-18 DIAGNOSIS — E785 Hyperlipidemia, unspecified: Secondary | ICD-10-CM | POA: Diagnosis not present

## 2020-09-18 DIAGNOSIS — Z0000 Encounter for general adult medical examination without abnormal findings: Secondary | ICD-10-CM | POA: Diagnosis not present

## 2020-09-18 DIAGNOSIS — I10 Essential (primary) hypertension: Secondary | ICD-10-CM | POA: Diagnosis not present

## 2020-09-18 DIAGNOSIS — M8580 Other specified disorders of bone density and structure, unspecified site: Secondary | ICD-10-CM | POA: Diagnosis not present

## 2020-09-22 DIAGNOSIS — H35352 Cystoid macular degeneration, left eye: Secondary | ICD-10-CM | POA: Diagnosis not present

## 2020-09-26 DIAGNOSIS — H10501 Unspecified blepharoconjunctivitis, right eye: Secondary | ICD-10-CM | POA: Diagnosis not present

## 2020-09-26 DIAGNOSIS — H02052 Trichiasis without entropian right lower eyelid: Secondary | ICD-10-CM | POA: Diagnosis not present

## 2020-10-05 DIAGNOSIS — H35351 Cystoid macular degeneration, right eye: Secondary | ICD-10-CM | POA: Diagnosis not present

## 2020-10-19 DIAGNOSIS — H35351 Cystoid macular degeneration, right eye: Secondary | ICD-10-CM | POA: Diagnosis not present

## 2020-11-15 DIAGNOSIS — H35351 Cystoid macular degeneration, right eye: Secondary | ICD-10-CM | POA: Diagnosis not present

## 2020-11-21 DIAGNOSIS — H59022 Cataract (lens) fragments in eye following cataract surgery, left eye: Secondary | ICD-10-CM | POA: Diagnosis not present

## 2021-01-23 DIAGNOSIS — H25041 Posterior subcapsular polar age-related cataract, right eye: Secondary | ICD-10-CM | POA: Diagnosis not present

## 2021-01-23 DIAGNOSIS — H2511 Age-related nuclear cataract, right eye: Secondary | ICD-10-CM | POA: Diagnosis not present

## 2021-01-23 DIAGNOSIS — H25811 Combined forms of age-related cataract, right eye: Secondary | ICD-10-CM | POA: Diagnosis not present

## 2021-02-22 DIAGNOSIS — H35352 Cystoid macular degeneration, left eye: Secondary | ICD-10-CM | POA: Diagnosis not present

## 2021-03-26 DIAGNOSIS — H35371 Puckering of macula, right eye: Secondary | ICD-10-CM | POA: Diagnosis not present

## 2021-03-26 DIAGNOSIS — H35351 Cystoid macular degeneration, right eye: Secondary | ICD-10-CM | POA: Diagnosis not present

## 2021-05-03 DIAGNOSIS — Z23 Encounter for immunization: Secondary | ICD-10-CM | POA: Diagnosis not present

## 2021-06-04 DIAGNOSIS — H35351 Cystoid macular degeneration, right eye: Secondary | ICD-10-CM | POA: Diagnosis not present

## 2021-06-04 DIAGNOSIS — H43812 Vitreous degeneration, left eye: Secondary | ICD-10-CM | POA: Diagnosis not present

## 2021-06-11 DIAGNOSIS — Z961 Presence of intraocular lens: Secondary | ICD-10-CM | POA: Diagnosis not present

## 2021-07-17 DIAGNOSIS — L821 Other seborrheic keratosis: Secondary | ICD-10-CM | POA: Diagnosis not present

## 2021-08-30 DIAGNOSIS — R109 Unspecified abdominal pain: Secondary | ICD-10-CM | POA: Diagnosis not present

## 2021-08-30 DIAGNOSIS — R1310 Dysphagia, unspecified: Secondary | ICD-10-CM | POA: Diagnosis not present

## 2021-10-12 DIAGNOSIS — E785 Hyperlipidemia, unspecified: Secondary | ICD-10-CM | POA: Diagnosis not present

## 2021-10-12 DIAGNOSIS — R1011 Right upper quadrant pain: Secondary | ICD-10-CM | POA: Diagnosis not present

## 2021-10-12 DIAGNOSIS — Z0000 Encounter for general adult medical examination without abnormal findings: Secondary | ICD-10-CM | POA: Diagnosis not present

## 2021-10-12 DIAGNOSIS — M8580 Other specified disorders of bone density and structure, unspecified site: Secondary | ICD-10-CM | POA: Diagnosis not present

## 2021-10-16 ENCOUNTER — Other Ambulatory Visit: Payer: Self-pay | Admitting: Family Medicine

## 2021-10-16 DIAGNOSIS — R1011 Right upper quadrant pain: Secondary | ICD-10-CM

## 2021-10-19 ENCOUNTER — Ambulatory Visit
Admission: RE | Admit: 2021-10-19 | Discharge: 2021-10-19 | Disposition: A | Discharge status: Home / Self Care | Payer: Medicare Other | Source: Ambulatory Visit | Attending: Family Medicine | Admitting: Family Medicine

## 2021-10-19 DIAGNOSIS — Z9049 Acquired absence of other specified parts of digestive tract: Secondary | ICD-10-CM | POA: Diagnosis not present

## 2021-10-19 DIAGNOSIS — R1011 Right upper quadrant pain: Secondary | ICD-10-CM

## 2021-11-06 ENCOUNTER — Other Ambulatory Visit: Payer: Self-pay | Admitting: Gastroenterology

## 2021-11-06 DIAGNOSIS — R1310 Dysphagia, unspecified: Secondary | ICD-10-CM

## 2021-11-13 ENCOUNTER — Ambulatory Visit
Admission: RE | Admit: 2021-11-13 | Discharge: 2021-11-13 | Disposition: A | Discharge status: Home / Self Care | Payer: Medicare Other | Source: Ambulatory Visit | Attending: Gastroenterology | Admitting: Gastroenterology

## 2021-11-13 DIAGNOSIS — R1310 Dysphagia, unspecified: Secondary | ICD-10-CM | POA: Diagnosis not present

## 2021-11-13 DIAGNOSIS — K219 Gastro-esophageal reflux disease without esophagitis: Secondary | ICD-10-CM | POA: Diagnosis not present

## 2021-11-30 ENCOUNTER — Other Ambulatory Visit: Payer: Self-pay | Admitting: Gastroenterology

## 2021-11-30 DIAGNOSIS — B354 Tinea corporis: Secondary | ICD-10-CM | POA: Diagnosis not present

## 2021-11-30 DIAGNOSIS — L03311 Cellulitis of abdominal wall: Secondary | ICD-10-CM | POA: Diagnosis not present

## 2021-12-03 DIAGNOSIS — Z961 Presence of intraocular lens: Secondary | ICD-10-CM | POA: Diagnosis not present

## 2021-12-03 DIAGNOSIS — H35371 Puckering of macula, right eye: Secondary | ICD-10-CM | POA: Diagnosis not present

## 2021-12-04 DIAGNOSIS — B354 Tinea corporis: Secondary | ICD-10-CM | POA: Diagnosis not present

## 2021-12-04 DIAGNOSIS — L089 Local infection of the skin and subcutaneous tissue, unspecified: Secondary | ICD-10-CM | POA: Diagnosis not present

## 2022-01-23 ENCOUNTER — Other Ambulatory Visit: Payer: Self-pay | Admitting: Family Medicine

## 2022-01-23 DIAGNOSIS — R197 Diarrhea, unspecified: Secondary | ICD-10-CM | POA: Diagnosis not present

## 2022-01-23 DIAGNOSIS — E041 Nontoxic single thyroid nodule: Secondary | ICD-10-CM | POA: Diagnosis not present

## 2022-01-23 DIAGNOSIS — R1110 Vomiting, unspecified: Secondary | ICD-10-CM | POA: Diagnosis not present

## 2022-01-28 ENCOUNTER — Ambulatory Visit
Admission: RE | Admit: 2022-01-28 | Discharge: 2022-01-28 | Disposition: A | Discharge status: Home / Self Care | Payer: Medicare Other | Source: Ambulatory Visit | Attending: Family Medicine | Admitting: Family Medicine

## 2022-01-28 DIAGNOSIS — E041 Nontoxic single thyroid nodule: Secondary | ICD-10-CM

## 2022-02-19 DIAGNOSIS — K219 Gastro-esophageal reflux disease without esophagitis: Secondary | ICD-10-CM | POA: Diagnosis not present

## 2022-02-19 DIAGNOSIS — R1310 Dysphagia, unspecified: Secondary | ICD-10-CM | POA: Diagnosis not present

## 2022-02-20 ENCOUNTER — Encounter (HOSPITAL_COMMUNITY): Payer: Self-pay | Admitting: Gastroenterology

## 2022-02-20 NOTE — Progress Notes (Signed)
Attempted to obtain medical history via telephone, unable to reach at this time. HIPAA compliant voicemail message left requesting return call to pre surgical testing department. 

## 2022-02-26 ENCOUNTER — Other Ambulatory Visit: Payer: Self-pay | Admitting: Family Medicine

## 2022-02-26 DIAGNOSIS — E041 Nontoxic single thyroid nodule: Secondary | ICD-10-CM

## 2022-02-27 ENCOUNTER — Other Ambulatory Visit: Payer: Self-pay

## 2022-02-27 ENCOUNTER — Ambulatory Visit (HOSPITAL_BASED_OUTPATIENT_CLINIC_OR_DEPARTMENT_OTHER): Payer: Medicare Other | Admitting: Certified Registered"

## 2022-02-27 ENCOUNTER — Ambulatory Visit (HOSPITAL_COMMUNITY)
Admission: RE | Admit: 2022-02-27 | Discharge: 2022-02-27 | Disposition: A | Discharge status: Home / Self Care | Payer: Medicare Other | Source: Ambulatory Visit | Attending: Gastroenterology | Admitting: Gastroenterology

## 2022-02-27 ENCOUNTER — Ambulatory Visit (HOSPITAL_COMMUNITY): Payer: Medicare Other | Admitting: Certified Registered"

## 2022-02-27 ENCOUNTER — Encounter (HOSPITAL_COMMUNITY): Payer: Self-pay | Admitting: Gastroenterology

## 2022-02-27 ENCOUNTER — Encounter (HOSPITAL_COMMUNITY)
Admission: RE | Disposition: A | Discharge status: Home / Self Care | Payer: Self-pay | Source: Ambulatory Visit | Attending: Gastroenterology

## 2022-02-27 DIAGNOSIS — K449 Diaphragmatic hernia without obstruction or gangrene: Secondary | ICD-10-CM | POA: Diagnosis not present

## 2022-02-27 DIAGNOSIS — I10 Essential (primary) hypertension: Secondary | ICD-10-CM | POA: Insufficient documentation

## 2022-02-27 DIAGNOSIS — K2100 Gastro-esophageal reflux disease with esophagitis, without bleeding: Secondary | ICD-10-CM

## 2022-02-27 DIAGNOSIS — K222 Esophageal obstruction: Secondary | ICD-10-CM | POA: Diagnosis not present

## 2022-02-27 DIAGNOSIS — R933 Abnormal findings on diagnostic imaging of other parts of digestive tract: Secondary | ICD-10-CM | POA: Diagnosis not present

## 2022-02-27 DIAGNOSIS — K209 Esophagitis, unspecified without bleeding: Secondary | ICD-10-CM | POA: Diagnosis not present

## 2022-02-27 DIAGNOSIS — R1310 Dysphagia, unspecified: Secondary | ICD-10-CM | POA: Diagnosis not present

## 2022-02-27 HISTORY — PX: ESOPHAGOGASTRODUODENOSCOPY (EGD) WITH PROPOFOL: SHX5813

## 2022-02-27 LAB — POCT I-STAT, CHEM 8
BUN: 16 mg/dL (ref 8–23)
Calcium, Ion: 1.22 mmol/L (ref 1.15–1.40)
Chloride: 104 mmol/L (ref 98–111)
Creatinine, Ser: 0.8 mg/dL (ref 0.44–1.00)
Glucose, Bld: 90 mg/dL (ref 70–99)
HCT: 42 % (ref 36.0–46.0)
Hemoglobin: 14.3 g/dL (ref 12.0–15.0)
Potassium: 3.9 mmol/L (ref 3.5–5.1)
Sodium: 140 mmol/L (ref 135–145)
TCO2: 23 mmol/L (ref 22–32)

## 2022-02-27 SURGERY — ESOPHAGOGASTRODUODENOSCOPY (EGD) WITH PROPOFOL
Anesthesia: Monitor Anesthesia Care | Laterality: Bilateral

## 2022-02-27 MED ORDER — LACTATED RINGERS IV SOLN: INTRAVENOUS | Status: DC

## 2022-02-27 MED ORDER — OMEPRAZOLE MAGNESIUM 20 MG PO TBEC: 40.0000 mg | DELAYED_RELEASE_TABLET | Freq: Two times a day (BID) | ORAL | 3 refills | Status: AC

## 2022-02-27 MED ORDER — LIDOCAINE HCL (PF) 2 % IJ SOLN
INTRAMUSCULAR | Status: DC | PRN
  Administered 2022-02-27: 150 mg via INTRADERMAL

## 2022-02-27 MED ORDER — PROPOFOL 500 MG/50ML IV EMUL
INTRAVENOUS | Status: DC | PRN
  Administered 2022-02-27: 50 mg via INTRAVENOUS
  Administered 2022-02-27: 50 ug/kg/min via INTRAVENOUS

## 2022-02-27 MED ORDER — SODIUM CHLORIDE 0.9 % IV SOLN: INTRAVENOUS | Status: DC

## 2022-02-27 SURGICAL SUPPLY — 15 items

## 2022-02-27 NOTE — Anesthesia Preprocedure Evaluation (Addendum)
Anesthesia Evaluation  Patient identified by MRN, date of birth, ID band Patient awake    Reviewed: Allergy & Precautions, NPO status , Patient's Chart, lab work & pertinent test results  Airway Mallampati: I  TM Distance: >3 FB Neck ROM: Full    Dental  (+) Edentulous Upper, Dental Advisory Given   Pulmonary neg pulmonary ROS,    breath sounds clear to auscultation       Cardiovascular hypertension,  Rhythm:Regular Rate:Normal     Neuro/Psych negative neurological ROS  negative psych ROS   GI/Hepatic Neg liver ROS, GERD  ,  Endo/Other  negative endocrine ROS  Renal/GU negative Renal ROS     Musculoskeletal negative musculoskeletal ROS (+)   Abdominal Normal abdominal exam  (+)   Peds  Hematology negative hematology ROS (+)   Anesthesia Other Findings   Reproductive/Obstetrics                            Anesthesia Physical Anesthesia Plan  ASA: 2  Anesthesia Plan: MAC   Post-op Pain Management:    Induction: Intravenous  PONV Risk Score and Plan: 0 and Propofol infusion and Ondansetron  Airway Management Planned: Natural Airway and Simple Face Mask  Additional Equipment: None  Intra-op Plan:   Post-operative Plan:   Informed Consent: I have reviewed the patients History and Physical, chart, labs and discussed the procedure including the risks, benefits and alternatives for the proposed anesthesia with the patient or authorized representative who has indicated his/her understanding and acceptance.       Plan Discussed with: CRNA  Anesthesia Plan Comments:        Anesthesia Quick Evaluation

## 2022-02-27 NOTE — Discharge Instructions (Signed)
YOU HAD AN ENDOSCOPIC PROCEDURE TODAY: Refer to the procedure report and other information in the discharge instructions given to you for any specific questions about what was found during the examination. If this information does not answer your questions, please call [facility] office at [phone] to clarify.   YOU SHOULD EXPECT: Some feelings of bloating in the abdomen. Passage of more gas than usual. Walking can help get rid of the air that was put into your GI tract during the procedure and reduce the bloating. If you had a lower endoscopy (such as a colonoscopy or flexible sigmoidoscopy) you may notice spotting of blood in your stool or on the toilet paper. Some abdominal soreness may be present for a day or two, also.  DIET: Your first meal following the procedure should be a light meal and then it is ok to progress to your normal diet. A half-sandwich or bowl of soup is an example of a good first meal. Heavy or fried foods are harder to digest and may make you feel nauseous or bloated. Drink plenty of fluids but you should avoid alcoholic beverages for 24 hours. If you had a esophageal dilation, please see attached instructions for diet.    ACTIVITY: Your care partner should take you home directly after the procedure. You should plan to take it easy, moving slowly for the rest of the day. You can resume normal activity the day after the procedure however YOU SHOULD NOT DRIVE, use power tools, machinery or perform tasks that involve climbing or major physical exertion for 24 hours (because of the sedation medicines used during the test).   SYMPTOMS TO REPORT IMMEDIATELY: A gastroenterologist can be reached at any hour. Please call [phone]  for any of the following symptoms:  Following upper endoscopy (EGD, EUS, ERCP, esophageal dilation) Vomiting of blood or coffee ground material  New, significant abdominal pain  New, significant chest pain or pain under the shoulder blades  Painful or  persistently difficult swallowing  New shortness of breath  Black, tarry-looking or red, bloody stools  FOLLOW UP:  If any biopsies were taken you will be contacted by phone or by letter within the next 1-3 weeks. Call [phone]  if you have not heard about the biopsies in 3 weeks.  Please also call with any specific questions about appointments or follow up tests. YOU HAD AN ENDOSCOPIC PROCEDURE TODAY: Refer to the procedure report and other information in the discharge instructions given to you for any specific questions about what was found during the examination. If this information does not answer your questions, please call [facility] office at [phone] to clarify.   

## 2022-02-27 NOTE — Op Note (Signed)
[facility] Patient Name: Jill Everett Procedure Date: 02/27/2022 MRN: [id number] Attending MD: Willis Modena , MD Date of Birth: [birth year] CSN: [id number] Age: 81 Admit Type: Outpatient Procedure:                Upper GI endoscopy Indications:              Dysphagia, Abnormal cine-esophagram Providers:                Willis Modena, MD, Glory Rosebush, RN, Beryle Beams, Technician, Leamon Arnt CRNA Referring MD:             Dr. Farris Has Medicines:                Monitored Anesthesia Care Complications:            No immediate complications. Estimated Blood Loss:     Estimated blood loss: none. Procedure:                Pre-Anesthesia Assessment:                           - Prior to the procedure, a History and Physical                            was performed, and patient medications and                            allergies were reviewed. The patient's tolerance of                            previous anesthesia was also reviewed. The risks                            and benefits of the procedure and the sedation                            options and risks were discussed with the patient.                            All questions were answered, and informed consent                            was obtained. Prior Anticoagulants: The patient has                            taken no previous anticoagulant or antiplatelet                            agents. ASA Grade Assessment: II - A patient with                            mild systemic disease. After reviewing the risks  and benefits, the patient was deemed in                            satisfactory condition to undergo the procedure.                           After obtaining informed consent, the endoscope was                            passed under direct vision. Throughout the                            procedure, the patient's blood pressure, pulse, and                             oxygen saturations were monitored continuously. The                            [id number]-H190 ([id number]) Olympus endoscope was introduced                            through the mouth, and advanced to the second part                            of duodenum. The upper GI endoscopy was                            accomplished without difficulty. The patient                            tolerated the procedure well. Scope In: Scope Out: Findings:      A large 8 cm hiatal hernia was present.      Esophagitis with no bleeding was found in the middle third of the       esophagus.      One benign-appearing, intrinsic moderate stenosis was found in the       middle third of the esophagus, in direct association with the field of       severe esophagitis. This stenosis measured 1.2 cm (inner diameter) x 1       cm (in length). The stenosis was traversed.      The exam of the stomach was otherwise normal.      The entire examined stomach was normal.      The duodenal bulb, first portion of the duodenum and second portion of       the duodenum were normal. Impression:               - Large 8 cm hiatal hernia.                           - Reflux esophagitis with no bleeding.                           - Benign-appearing esophageal stenosis at location  of esophagitis.                           - Normal stomach.                           - Normal duodenal bulb, first portion of the                            duodenum and second portion of the duodenum. Moderate Sedation:      None Recommendation:           - Patient has a contact number available for                            emergencies. The signs and symptoms of potential                            delayed complications were discussed with the                            patient. Return to normal activities tomorrow.                            Written discharge instructions were provided to the                             patient.                           - Resume previous diet today.                           - Continue present medications.                           - Stricture risk-prohibitive in light of severe                            esophagitis. Patient appears only to be on                            famotidine. Will need to add BID PPI.                           - Avoid ASA/NSAIDs.                           - Return to GI clinic in 4 weeks.                           - Consider repeat endoscopy for possible esophageal                            dilatation after 6 weeks of BID PPI therapy; should  continue taking famotidine.                           - Return to referring physician as previously                            scheduled. Procedure Code(s):        --- Professional ---                           [id number], Esophagogastroduodenoscopy, flexible,                            transoral; diagnostic, including collection of                            specimen(s) by brushing or washing, when performed                            (separate procedure) Diagnosis Code(s):        --- Professional ---                           K44.9, Diaphragmatic hernia without obstruction or                            gangrene                           K21.00, Gastro-esophageal reflux disease with                            esophagitis, without bleeding                           K22.2, Esophageal obstruction                           R13.10, Dysphagia, unspecified                           R93.3, Abnormal findings on diagnostic imaging of                            other parts of digestive tract CPT copyright 2019 [employer]. All rights reserved. The codes documented in this report are preliminary and upon coder review may  be revised to meet current compliance requirements. Willis Modena, MD 02/27/2022 9:56:29 AM This report has been signed electronically. Number  of Addenda: 0

## 2022-02-27 NOTE — Transfer of Care (Signed)
Immediate Anesthesia Transfer of Care Note  Patient: Jill Everett  Procedure(s) Performed: ESOPHAGOGASTRODUODENOSCOPY (EGD) WITH PROPOFOL (Bilateral) BALLOON DILATION (Bilateral)  Patient Location: PACU  Anesthesia Type:MAC  Level of Consciousness: awake  Airway & Oxygen Therapy: Patient Spontanous Breathing  Post-op Assessment: Report given to RN  Post vital signs: stable  Last Vitals:  Vitals Value Taken Time  BP 115/70 02/27/22 0950  Temp    Pulse 63 02/27/22 0951  Resp 15 02/27/22 0951  SpO2 98 % 02/27/22 0951  Vitals shown include unvalidated device data.  Last Pain:  Vitals:   02/27/22 0830  TempSrc: Temporal  PainSc: 0-No pain         Complications: No notable events documented.

## 2022-02-27 NOTE — H&P (Signed)
[facility] H/P Note  Chief Complaint: dysphagia  HPI: Jill Everett is an 81 y.o. female.  Dysphagia with esophageal stricture on esophagram.  Past Medical History:  Diagnosis Date   Distal radial fracture    right   Hyperlipidemia    Hypertension    Sinusitis     Past Surgical History:  Procedure Laterality Date   ABDOMINAL HYSTERECTOMY     APPENDECTOMY     CHOLECYSTECTOMY     OPEN REDUCTION INTERNAL FIXATION (ORIF) DISTAL RADIAL FRACTURE Left 05/18/2015   Procedure: OPEN REDUCTION INTERNAL FIXATION (ORIF) LEFT DISTAL RADIAL FRACTURE;  Surgeon: Betha Loa, MD;  Location: [facility];  Service: Orthopedics;  Laterality: Left;   TONSILLECTOMY      Medications Prior to Admission  Medication Sig Dispense Refill   Biotin (BIOTIN 5000) 5 MG CAPS Take 5 mg by mouth daily.     Cholecalciferol (VITAMIN D) 50 MCG (2000 UT) tablet Take 2,000 Units by mouth daily.     famotidine (PEPCID AC MAXIMUM STRENGTH) 20 MG tablet Take 20 mg by mouth 2 (two) times daily.     Multiple Vitamin (MULTIVITAMIN WITH MINERALS) TABS tablet Take 1 tablet by mouth daily.     Omega-3 Fatty Acids (FISH OIL) 1200 MG CAPS Take 1,200 mg by mouth daily.      Allergies:  Allergies  Allergen Reactions   Pine Other (See Comments)    Pine nuts "Made me hot and throw up"    Family History  Problem Relation Age of Onset   Breast cancer Neg Hx     Social History:  reports that she has never smoked. She does not have any smokeless tobacco history on file. She reports current alcohol use. She reports that she does not use drugs.   ROS: As per HPI, all others negative   Blood pressure (!) 164/76, pulse 67, temperature (!) 97.4 F (36.3 C), temperature source Temporal, resp. rate 13, height 5\' 1"  (1.549 m), weight 51.7 kg, SpO2 98 %. General appearance: NAD, elderly but healthy appearing HEENT:  [place]/AT, anicteric NECK:  Supple, goiter CV:  Regular ABD:  Soft, non-tender NEURO:   A/O, non-focal, no encephalopathy  No results found for this or any previous visit (from the past 48 hour(s)). No results found.  Assessment/Plan   Dysphagia. 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 Endoscopy with possible esophageal dilatation. Risks (bleeding, infection, bowel perforation that could require surgery, sedation-related changes in cardiopulmonary systems), benefits (identification and possible treatment of source of symptoms, exclusion of certain causes of symptoms), and alternatives (watchful waiting, radiographic imaging studies, empiric medical treatment) of upper endoscopy (EGD) were explained to patient/family in detail and patient wishes to proceed.   02/27/2022, 9:20 AM

## 2022-02-28 NOTE — Anesthesia Postprocedure Evaluation (Signed)
Anesthesia Post Note  Patient: Jill Everett  Procedure(s) Performed: ESOPHAGOGASTRODUODENOSCOPY (EGD) WITH PROPOFOL (Bilateral)     Patient location during evaluation: PACU Anesthesia Type: MAC Level of consciousness: awake and alert Pain management: pain level controlled Vital Signs Assessment: post-procedure vital signs reviewed and stable Respiratory status: spontaneous breathing, nonlabored ventilation, respiratory function stable and patient connected to nasal cannula oxygen Cardiovascular status: stable and blood pressure returned to baseline Postop Assessment: no apparent nausea or vomiting Anesthetic complications: no   No notable events documented.  Last Vitals:  Vitals:   02/27/22 1000 02/27/22 1010  BP: (!) 155/74 (!) 164/65  Pulse: 69 65  Resp: 19 17  Temp:    SpO2: 96% 99%    Last Pain:  Vitals:   02/27/22 1010  TempSrc:   PainSc: 0-No pain                 Effie Berkshire

## 2022-03-05 ENCOUNTER — Ambulatory Visit
Admission: RE | Admit: 2022-03-05 | Discharge: 2022-03-05 | Disposition: A | Discharge status: Home / Self Care | Payer: Medicare Other | Source: Ambulatory Visit | Attending: Family Medicine | Admitting: Family Medicine

## 2022-03-05 ENCOUNTER — Other Ambulatory Visit (HOSPITAL_COMMUNITY)
Admission: RE | Admit: 2022-03-05 | Discharge: 2022-03-05 | Disposition: A | Discharge status: Home / Self Care | Payer: Medicare Other | Source: Ambulatory Visit | Attending: Student | Admitting: Student

## 2022-03-05 DIAGNOSIS — E041 Nontoxic single thyroid nodule: Secondary | ICD-10-CM | POA: Diagnosis not present

## 2022-03-06 LAB — CYTOLOGY - NON PAP

## 2022-04-05 DIAGNOSIS — R1310 Dysphagia, unspecified: Secondary | ICD-10-CM | POA: Diagnosis not present

## 2022-04-05 DIAGNOSIS — K2210 Ulcer of esophagus without bleeding: Secondary | ICD-10-CM | POA: Diagnosis not present

## 2022-06-07 DIAGNOSIS — Z23 Encounter for immunization: Secondary | ICD-10-CM | POA: Diagnosis not present

## 2022-07-11 DIAGNOSIS — R1310 Dysphagia, unspecified: Secondary | ICD-10-CM | POA: Diagnosis not present

## 2022-07-11 DIAGNOSIS — K2210 Ulcer of esophagus without bleeding: Secondary | ICD-10-CM | POA: Diagnosis not present

## 2022-10-30 DIAGNOSIS — E559 Vitamin D deficiency, unspecified: Secondary | ICD-10-CM | POA: Diagnosis not present

## 2022-10-30 DIAGNOSIS — R030 Elevated blood-pressure reading, without diagnosis of hypertension: Secondary | ICD-10-CM | POA: Diagnosis not present

## 2022-10-30 DIAGNOSIS — E785 Hyperlipidemia, unspecified: Secondary | ICD-10-CM | POA: Diagnosis not present

## 2022-10-30 DIAGNOSIS — R1310 Dysphagia, unspecified: Secondary | ICD-10-CM | POA: Diagnosis not present

## 2022-10-30 DIAGNOSIS — Z5181 Encounter for therapeutic drug level monitoring: Secondary | ICD-10-CM | POA: Diagnosis not present

## 2022-10-30 DIAGNOSIS — Z0000 Encounter for general adult medical examination without abnormal findings: Secondary | ICD-10-CM | POA: Diagnosis not present

## 2022-12-13 DIAGNOSIS — H5213 Myopia, bilateral: Secondary | ICD-10-CM | POA: Diagnosis not present

## 2022-12-13 DIAGNOSIS — H35371 Puckering of macula, right eye: Secondary | ICD-10-CM | POA: Diagnosis not present

## 2022-12-13 DIAGNOSIS — Z961 Presence of intraocular lens: Secondary | ICD-10-CM | POA: Diagnosis not present

## 2023-05-08 DIAGNOSIS — K2210 Ulcer of esophagus without bleeding: Secondary | ICD-10-CM | POA: Diagnosis not present

## 2023-05-08 DIAGNOSIS — R1310 Dysphagia, unspecified: Secondary | ICD-10-CM | POA: Diagnosis not present

## 2023-06-10 DIAGNOSIS — Z23 Encounter for immunization: Secondary | ICD-10-CM | POA: Diagnosis not present

## 2023-06-14 IMAGING — RF DG ESOPHAGUS
5 series · 14 of 24 positions shown · non-contrast
Comparison: 09/16/2018

CLINICAL DATA: Dysphagia. Solid food sticking in chest. Hiatal
hernia and gastroesophageal reflux.

EXAM:
ESOPHOGRAM / BARIUM SWALLOW / BARIUM TABLET STUDY
TECHNIQUE: Combined double contrast and single contrast examination performed
using effervescent crystals, thick barium liquid, and thin barium
liquid. The patient was observed with fluoroscopy swallowing a 13 mm
barium sulphate tablet.
FLUOROSCOPY:
Radiation Exposure Index (as provided by the fluoroscopic device):
5.5 mGy Kerma

[Series 1: sequence · 2 of 21 frames shown (1 of 3)]
[frame 4/21]
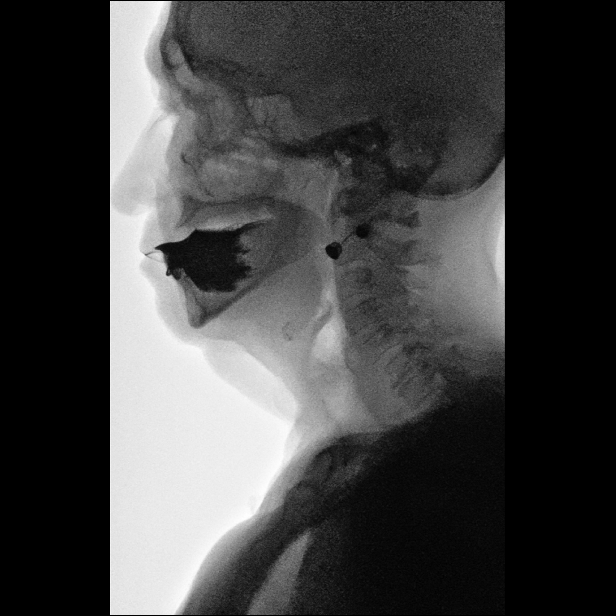
[frame 11/21]
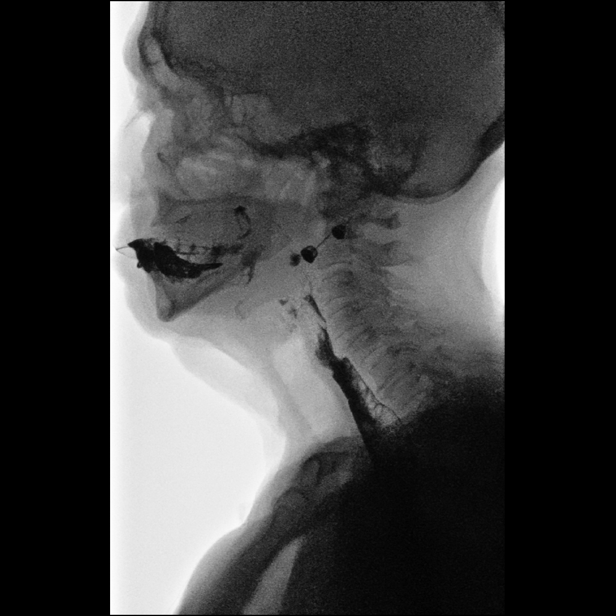

[Series 2: sequence · 2 of 27 frames shown (2 of 3)]
[frame 5/27]
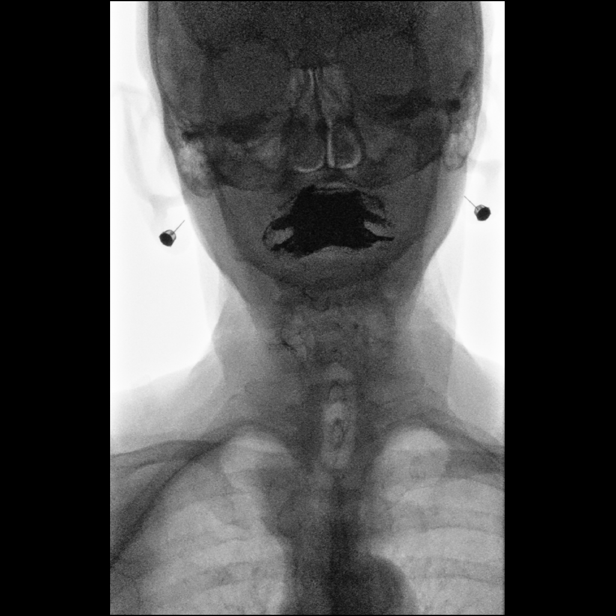
[frame 23/27]
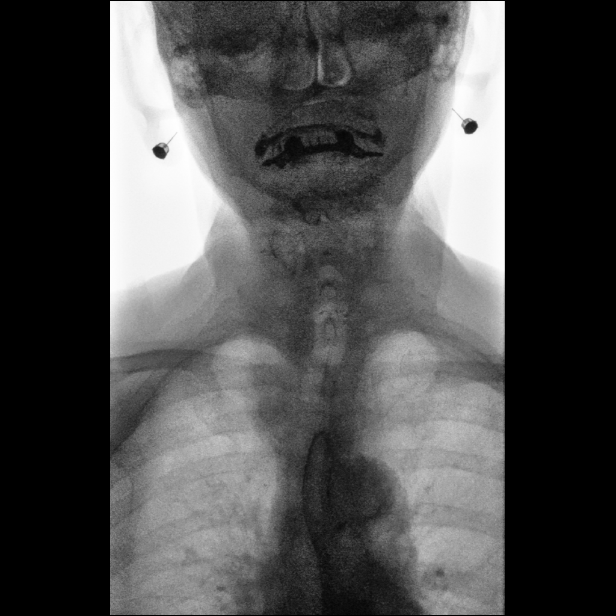

[Series 3: one shot · 4 of 6 slices shown (1 of 2)]
[im 1/6]
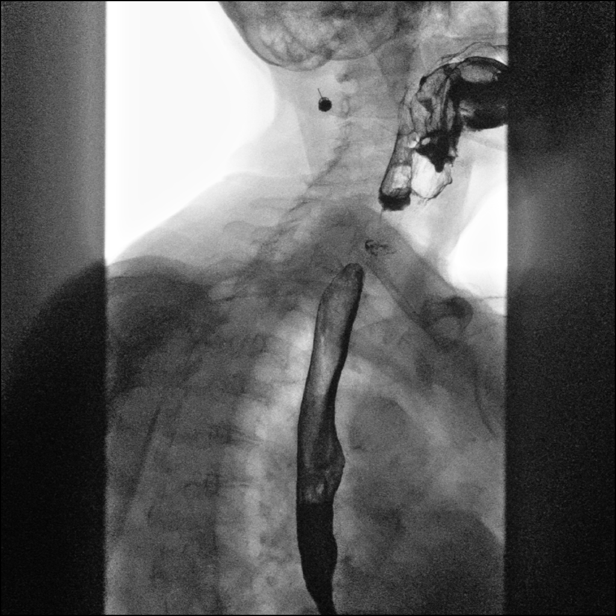
[im 3/6]
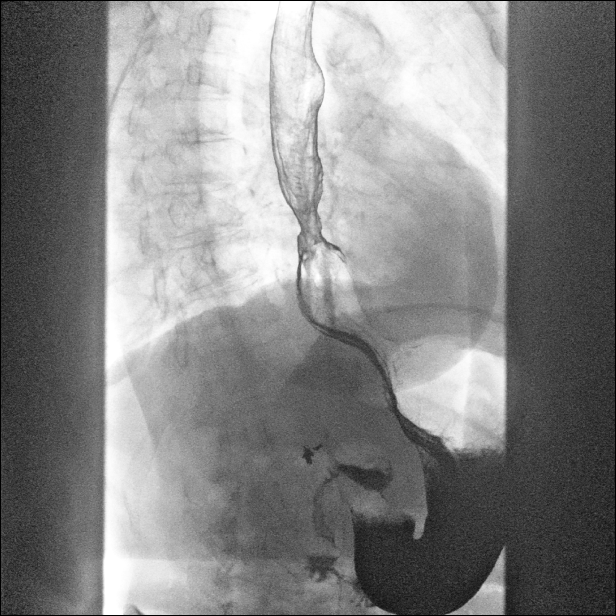
[im 5/6]
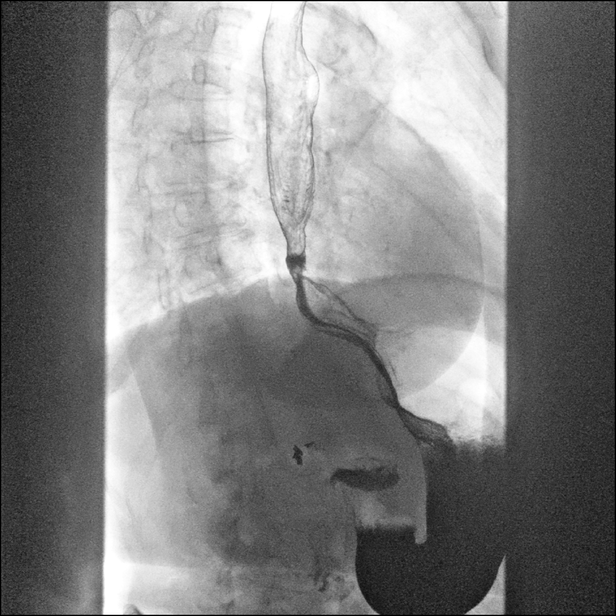
[im 6/6]
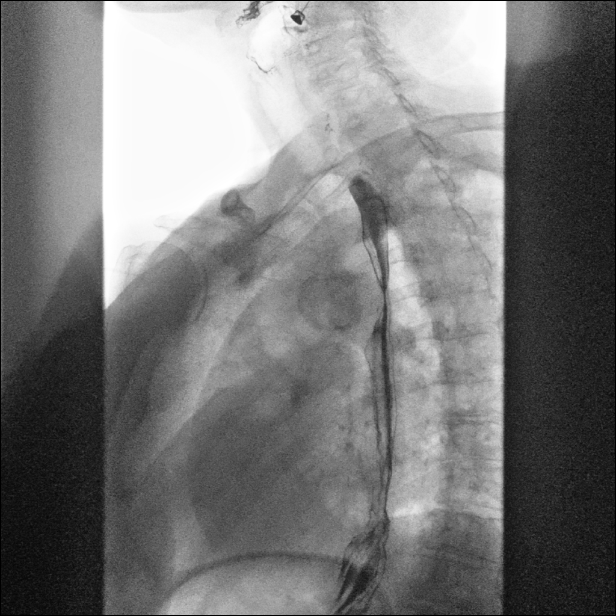

[Series 4: sequence · 2 of 25 frames shown (3 of 3)]
[frame 13/25]
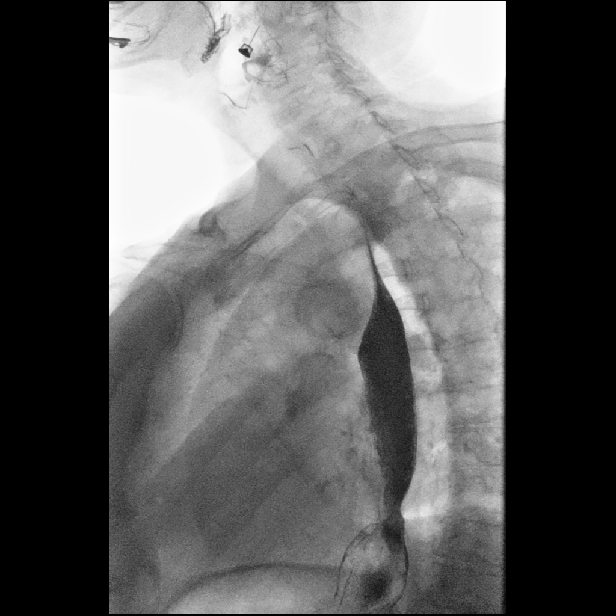
[frame 23/25]
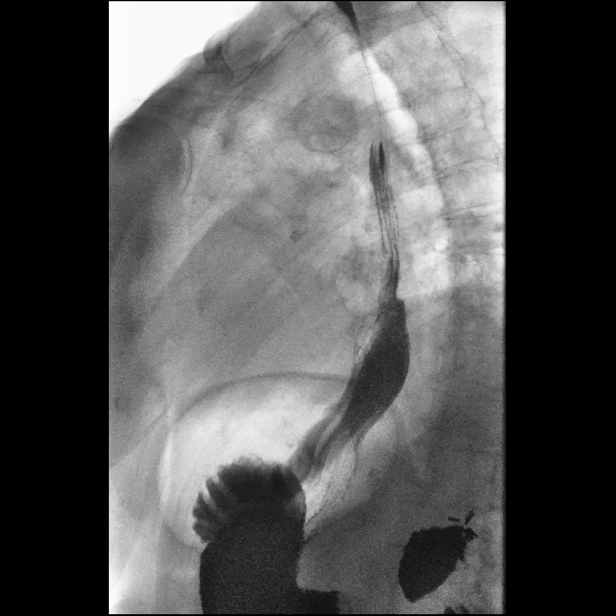

[Series 5: one shot · 4 of 8 slices shown (2 of 2)]
[im 3/8]
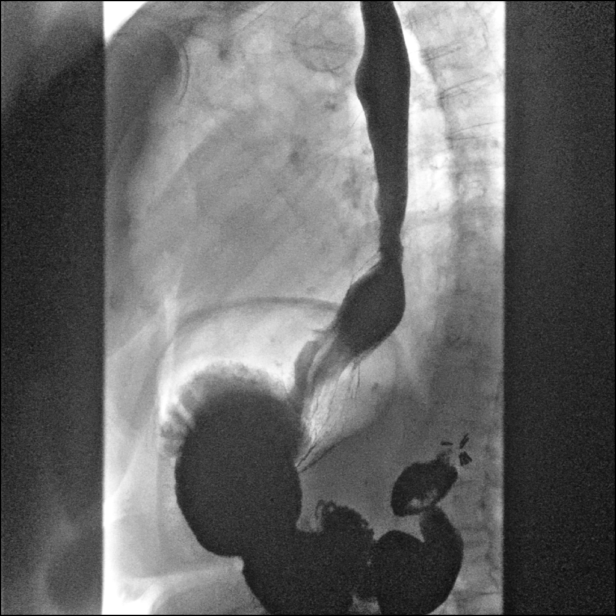
[im 4/8]
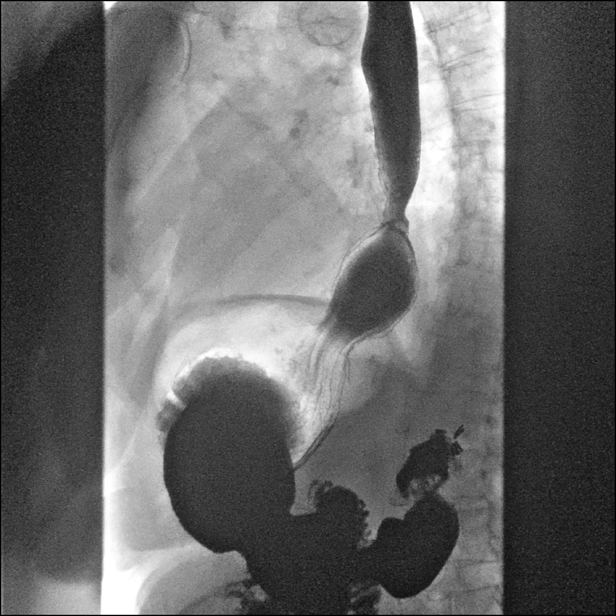
[im 6/8]
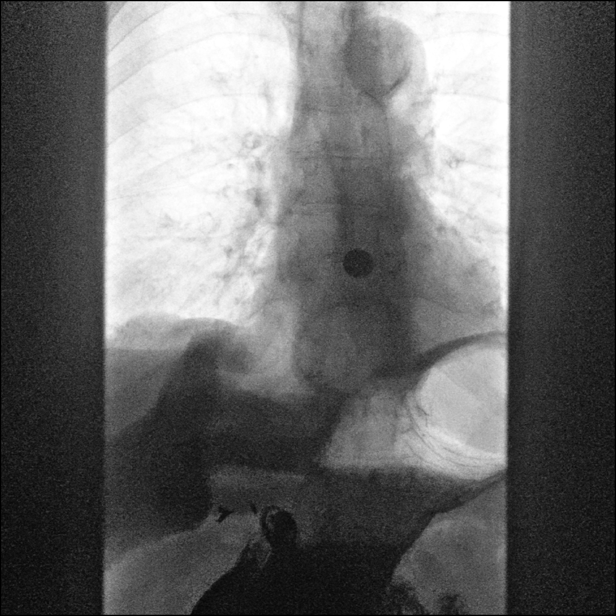
[im 8/8]
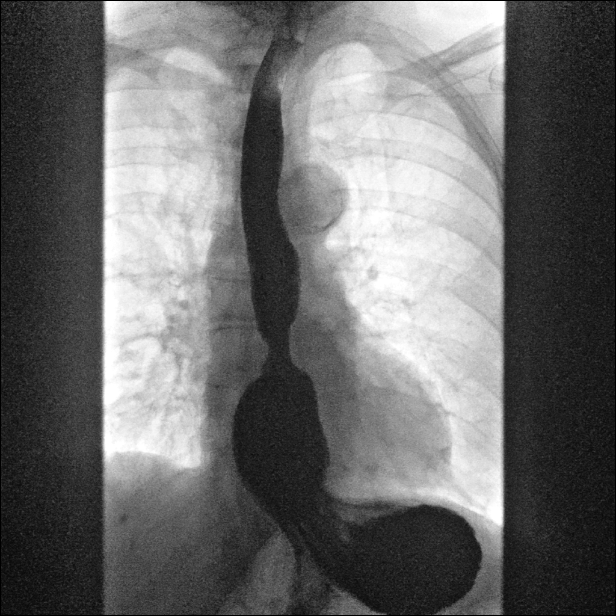

[14 of 24 positions shown; findings below may reference images not displayed]

FINDINGS: No evidence of vestibular penetration or aspiration during
swallowing. Pharynx is unremarkable in appearance. Extrinsic mass
effect is seen on the right side of the cervical esophagus, causing
deviation to the left. This is unchanged since prior study, and
suspicious for a right-sided thyroid nodule or goiter.

A small sliding hiatal hernia is again seen, without significant
change. A moderate to severe stricture is seen involving the distal
thoracic esophagus, with increased narrowing compared to prior
study. This prevented passage of a 13 mm barium tablet. Mild
esophageal fold thickening and mucosal irregularity is again seen
just superior to the stricture, consistent with esophagitis.

Esophageal motility is within normal limits. Severe gastroesophageal
reflux is seen to the level the cervical esophagus.
IMPRESSION: Small sliding hiatal hernia, without significant change.

Severe gastroesophageal reflux.

Worsening stricture of the distal thoracic esophagus, which prevents
passage of a 13 mm barium tablet. Esophagitis again seen just above
the stricture. Barrett's esophagus or esophageal carcinoma cannot be
excluded. Endoscopy is recommended for direct visualization.

Stable extrinsic mass effect on the right side of the cervical
esophagus, suspicious for right-sided thyroid nodule or goiter.
Thyroid ultrasound is recommended for further evaluation.

## 2023-09-30 DIAGNOSIS — H6122 Impacted cerumen, left ear: Secondary | ICD-10-CM | POA: Diagnosis not present

## 2023-09-30 DIAGNOSIS — H9193 Unspecified hearing loss, bilateral: Secondary | ICD-10-CM | POA: Diagnosis not present

## 2023-09-30 DIAGNOSIS — R030 Elevated blood-pressure reading, without diagnosis of hypertension: Secondary | ICD-10-CM | POA: Diagnosis not present

## 2023-11-12 DIAGNOSIS — Z792 Long term (current) use of antibiotics: Secondary | ICD-10-CM | POA: Diagnosis not present

## 2023-11-12 DIAGNOSIS — S20469A Insect bite (nonvenomous) of unspecified back wall of thorax, initial encounter: Secondary | ICD-10-CM | POA: Diagnosis not present

## 2023-11-18 DIAGNOSIS — M8580 Other specified disorders of bone density and structure, unspecified site: Secondary | ICD-10-CM | POA: Diagnosis not present

## 2023-11-18 DIAGNOSIS — E785 Hyperlipidemia, unspecified: Secondary | ICD-10-CM | POA: Diagnosis not present

## 2023-11-18 DIAGNOSIS — Z5181 Encounter for therapeutic drug level monitoring: Secondary | ICD-10-CM | POA: Diagnosis not present

## 2023-11-18 DIAGNOSIS — Z0000 Encounter for general adult medical examination without abnormal findings: Secondary | ICD-10-CM | POA: Diagnosis not present

## 2023-11-18 DIAGNOSIS — I10 Essential (primary) hypertension: Secondary | ICD-10-CM | POA: Diagnosis not present

## 2023-12-22 DIAGNOSIS — H35371 Puckering of macula, right eye: Secondary | ICD-10-CM | POA: Diagnosis not present

## 2023-12-22 DIAGNOSIS — H5213 Myopia, bilateral: Secondary | ICD-10-CM | POA: Diagnosis not present

## 2023-12-22 DIAGNOSIS — Z961 Presence of intraocular lens: Secondary | ICD-10-CM | POA: Diagnosis not present

## 2024-01-06 DIAGNOSIS — L259 Unspecified contact dermatitis, unspecified cause: Secondary | ICD-10-CM | POA: Diagnosis not present

## 2024-04-19 DIAGNOSIS — Z1211 Encounter for screening for malignant neoplasm of colon: Secondary | ICD-10-CM | POA: Diagnosis not present

## 2024-04-19 DIAGNOSIS — R1310 Dysphagia, unspecified: Secondary | ICD-10-CM | POA: Diagnosis not present

## 2024-04-19 DIAGNOSIS — K2210 Ulcer of esophagus without bleeding: Secondary | ICD-10-CM | POA: Diagnosis not present

## 2024-05-25 DIAGNOSIS — Z23 Encounter for immunization: Secondary | ICD-10-CM | POA: Diagnosis not present
